# Patient Record
Sex: Female | Born: 1975 | Race: White | Hispanic: No | Marital: Married | Smoking: Never smoker
Health system: Southern US, Community
[De-identification: ages and names within clinical notes are randomized; demographics above are authoritative.]

## PROBLEM LIST (undated history)

## (undated) ENCOUNTER — Inpatient Hospital Stay (HOSPITAL_COMMUNITY): Payer: Self-pay

## (undated) DIAGNOSIS — O139 Gestational [pregnancy-induced] hypertension without significant proteinuria, unspecified trimester: Secondary | ICD-10-CM

## (undated) DIAGNOSIS — K219 Gastro-esophageal reflux disease without esophagitis: Secondary | ICD-10-CM

## (undated) HISTORY — PX: FRACTURE SURGERY: SHX138

---

## 2003-04-05 HISTORY — PX: DILATION AND CURETTAGE OF UTERUS: SHX78

## 2004-02-02 ENCOUNTER — Ambulatory Visit: Payer: Self-pay

## 2004-02-03 ENCOUNTER — Ambulatory Visit: Payer: Self-pay

## 2005-04-04 HISTORY — PX: DILATION AND CURETTAGE OF UTERUS: SHX78

## 2005-04-20 ENCOUNTER — Ambulatory Visit: Payer: Self-pay | Admitting: Obstetrics & Gynecology

## 2005-04-22 ENCOUNTER — Ambulatory Visit: Payer: Self-pay | Admitting: Obstetrics & Gynecology

## 2005-06-23 ENCOUNTER — Emergency Department: Payer: Self-pay | Admitting: Internal Medicine

## 2005-09-23 ENCOUNTER — Emergency Department: Payer: Self-pay | Admitting: Emergency Medicine

## 2006-05-15 DIAGNOSIS — O1493 Unspecified pre-eclampsia, third trimester: Secondary | ICD-10-CM

## 2008-01-03 ENCOUNTER — Emergency Department: Payer: Self-pay | Admitting: Emergency Medicine

## 2015-01-22 ENCOUNTER — Encounter: Payer: Self-pay | Admitting: General Surgery

## 2015-01-22 ENCOUNTER — Ambulatory Visit (INDEPENDENT_AMBULATORY_CARE_PROVIDER_SITE_OTHER): Payer: BLUE CROSS/BLUE SHIELD | Admitting: General Surgery

## 2015-01-22 ENCOUNTER — Other Ambulatory Visit: Payer: BLUE CROSS/BLUE SHIELD

## 2015-01-22 VITALS — BP 110/68 | HR 78 | Resp 12 | Ht 60.0 in | Wt 110.0 lb

## 2015-01-22 DIAGNOSIS — N6310 Unspecified lump in the right breast, unspecified quadrant: Secondary | ICD-10-CM

## 2015-01-22 DIAGNOSIS — N63 Unspecified lump in breast: Secondary | ICD-10-CM | POA: Diagnosis not present

## 2015-01-22 NOTE — Patient Instructions (Signed)
The patient is aware to call back for any questions or concerns.  

## 2015-01-22 NOTE — Progress Notes (Signed)
Patient ID: Bethany PedroBrandi S Mendoza, female   DOB: [birth year], 39 y.o.   MRN: [id number]  Chief Complaint  Patient presents with  . Breast Problem    nodule    HPI Bethany Mendoza is a 39 y.o. female.  who presents for a breast evaluation of a palpable right breast mass. She noticed it Monday about the size of a dime. She states it maybe a little tender. Patient does perform regular self breast checks.   She is in the process of IVF.  HPI  History reviewed. No pertinent past medical history.  Past Surgical History  Procedure Laterality Date  . Fracture surgery Left     age 39    Family History  Problem Relation Age of Onset  . Colon polyps Mother   . Colonic polyp Maternal Uncle     Social History Social History  Substance Use Topics  . Smoking status: Never Smoker   . Smokeless tobacco: None  . Alcohol Use: 0.0 oz/week    0 Standard drinks or equivalent per week     Comment: 1-2 week    Allergies  Allergen Reactions  . Penicillins Rash  . Sulfa Antibiotics Rash    Current Outpatient Prescriptions  Medication Sig Dispense Refill  . aspirin 81 MG tablet Take 81 mg by mouth daily.    . Cholecalciferol (VITAMIN D-3) 1000 UNITS CAPS Take by mouth daily.    Marland Kitchen. Leuprolide Acetate (LUPRON IJ) Inject 5 Units as directed daily.    . Multiple Vitamin (MULTIVITAMIN) capsule Take 1 capsule by mouth daily.    Marland Kitchen. estradiol (ESTRACE) 2 MG tablet Take 2 mg by mouth 3 (three) times daily.   0  . sertraline (ZOLOFT) 100 MG tablet TK 1 AND 1/2 TS PO D FOR MOOD  5  . VYVANSE 50 MG capsule TK 1 C PO QAM  0   No current facility-administered medications for this visit.    Review of Systems Review of Systems  Blood pressure 110/68, pulse 78, resp. rate 12, height 5' (1.524 m), weight 110 lb (49.896 kg), last menstrual period 12/25/2014.  Physical Exam Physical Exam  Constitutional: She is oriented to person, place, and time. She appears well-developed and well-nourished.  HENT:   Mouth/Throat: Oropharynx is clear and moist.  Eyes: Conjunctivae are normal. No scleral icterus.  Neck: Neck supple.  Cardiovascular: Normal rate, regular rhythm and normal heart sounds.   Pulmonary/Chest: Effort normal and breath sounds normal. Right breast exhibits mass. Right breast exhibits no inverted nipple, no nipple discharge, no skin change and no tenderness. Left breast exhibits no inverted nipple, no mass, no nipple discharge, no skin change and no tenderness.  2.5 cm form rubbery mobile mass right breast near axillary tail   Lymphadenopathy:    She has no cervical adenopathy.    She has no axillary adenopathy.  Neurological: She is alert and oriented to person, place, and time.  Skin: Skin is warm and dry.  Psychiatric: Her behavior is normal.    Data Reviewed Progress notes. Targeted ultrasound was preformed over the right breast mass, showed a bi lobed cystic mass.  Assessment    The right breast mass was a benign cyst that was successfully aspirated with consent.     Plan    Follow up in 3 months. May proceed with IVF.      PCP:  None Ref: Dr Rayvon Charosenow  SANKAR,SEEPLAPUTHUR G 01/22/2015, 6:19 PM

## 2015-03-11 LAB — OB RESULTS CONSOLE RUBELLA ANTIBODY, IGM: RUBELLA: IMMUNE

## 2015-03-11 LAB — OB RESULTS CONSOLE ABO/RH: RH Type: POSITIVE

## 2015-03-11 LAB — OB RESULTS CONSOLE HEPATITIS B SURFACE ANTIGEN: HEP B S AG: NEGATIVE

## 2015-03-11 LAB — OB RESULTS CONSOLE GC/CHLAMYDIA
CHLAMYDIA, DNA PROBE: NEGATIVE
GC PROBE AMP, GENITAL: NEGATIVE

## 2015-03-11 LAB — OB RESULTS CONSOLE RPR: RPR: REACTIVE

## 2015-03-11 LAB — OB RESULTS CONSOLE HIV ANTIBODY (ROUTINE TESTING): HIV: NONREACTIVE

## 2015-04-05 NOTE — L&D Delivery Note (Signed)
Delivery Note   Bethany Mendoza, Girl Bethany Mendoza [[id number]][[id number]]  At 5:08 PM a viable and healthy female was delivered via Vaginal, Spontaneous Delivery (Presentation: ;LOA ).  APGAR: 8, 9; weight  pending.   Placenta status: spontaneous, intact.  Cord:  with the following complications: none.  Anesthesia:  epidural Episiotomy:  none Lacerations:  fourth Suture Repair: 2.0 3.0 vicryl rapide and 4-0, O vicryl Est. Blood Loss (mL):  500    Craige CottaLewis, GirlB Bethany Mendoza [[id number]][[id number]]  At 5:39 PM a viable and healthy female was delivered via  (Presentation: LOA  ).  APGAR: 8, 9; weight  pending.   Placenta status: spontaneous, intact.  Cord:  with the following complications: none.  Outlet  VAVD with Kiwi x 3 pulls and no pop offs Due to maternal exhaustion. Verbal consent: obtained from patient.  Risks and benefits discussed in detail.  Risks include, but are not limited to the risks of anesthesia, bleeding, infection, damage to maternal tissues, fetal cephalhematoma.  There is also the risk of inability to effect vaginal delivery of the head, or shoulder dystocia that cannot be resolved by established maneuvers, leading to the need for emergency cesarean section     Mom to postpartum.   Baby A to Couplet care / Skin to Skin.   Baby B to Couplet care / Skin to Skin.    Otis BraceLewis, GirlA Bethany Mendoza [[id number]][[id number]]    Verbal consent: obtained from patient.  Risks and benefits discussed in detail.  Risks include, but are not limited to the risks of anesthesia, bleeding, infection, damage to maternal tissues, fetal cephalhematoma.  There is also the risk of inability to effect vaginal delivery of the head, or shoulder dystocia that cannot be resolved by established maneuvers, leading to the need for emergency cesarean section     Craige CottaLewis, GirlB Shavaughn [[id number]][[id number]]      Johnthomas Lader J 09/28/2015, 6:08 PM     Pape Parson J 09/28/2015, 6:04 PM

## 2015-04-28 ENCOUNTER — Ambulatory Visit: Payer: Self-pay | Admitting: General Surgery

## 2015-06-22 ENCOUNTER — Inpatient Hospital Stay (HOSPITAL_COMMUNITY)
Admission: AD | Admit: 2015-06-22 | Discharge: 2015-07-27 | DRG: 781 | Disposition: A | Discharge status: Home / Self Care | Payer: BLUE CROSS/BLUE SHIELD | Source: Ambulatory Visit | Attending: Obstetrics | Admitting: Obstetrics

## 2015-06-22 ENCOUNTER — Inpatient Hospital Stay (HOSPITAL_COMMUNITY): Payer: BLUE CROSS/BLUE SHIELD

## 2015-06-22 ENCOUNTER — Encounter (HOSPITAL_COMMUNITY): Payer: Self-pay

## 2015-06-22 ENCOUNTER — Ambulatory Visit (HOSPITAL_COMMUNITY): Payer: BLUE CROSS/BLUE SHIELD

## 2015-06-22 DIAGNOSIS — K219 Gastro-esophageal reflux disease without esophagitis: Secondary | ICD-10-CM | POA: Diagnosis present

## 2015-06-22 DIAGNOSIS — O368390 Maternal care for abnormalities of the fetal heart rate or rhythm, unspecified trimester, not applicable or unspecified: Secondary | ICD-10-CM

## 2015-06-22 DIAGNOSIS — O99612 Diseases of the digestive system complicating pregnancy, second trimester: Secondary | ICD-10-CM | POA: Diagnosis present

## 2015-06-22 DIAGNOSIS — Z3A25 25 weeks gestation of pregnancy: Secondary | ICD-10-CM

## 2015-06-22 DIAGNOSIS — Z3A24 24 weeks gestation of pregnancy: Secondary | ICD-10-CM

## 2015-06-22 DIAGNOSIS — O09522 Supervision of elderly multigravida, second trimester: Secondary | ICD-10-CM

## 2015-06-22 DIAGNOSIS — O3432 Maternal care for cervical incompetence, second trimester: Secondary | ICD-10-CM | POA: Diagnosis present

## 2015-06-22 DIAGNOSIS — O09292 Supervision of pregnancy with other poor reproductive or obstetric history, second trimester: Secondary | ICD-10-CM

## 2015-06-22 DIAGNOSIS — Z3A23 23 weeks gestation of pregnancy: Secondary | ICD-10-CM | POA: Diagnosis not present

## 2015-06-22 DIAGNOSIS — O09812 Supervision of pregnancy resulting from assisted reproductive technology, second trimester: Secondary | ICD-10-CM

## 2015-06-22 DIAGNOSIS — Z3490 Encounter for supervision of normal pregnancy, unspecified, unspecified trimester: Secondary | ICD-10-CM

## 2015-06-22 DIAGNOSIS — N883 Incompetence of cervix uteri: Secondary | ICD-10-CM

## 2015-06-22 DIAGNOSIS — O26872 Cervical shortening, second trimester: Secondary | ICD-10-CM

## 2015-06-22 DIAGNOSIS — O30042 Twin pregnancy, dichorionic/diamniotic, second trimester: Secondary | ICD-10-CM | POA: Diagnosis present

## 2015-06-22 DIAGNOSIS — Z23 Encounter for immunization: Secondary | ICD-10-CM | POA: Diagnosis not present

## 2015-06-22 DIAGNOSIS — R001 Bradycardia, unspecified: Secondary | ICD-10-CM

## 2015-06-22 DIAGNOSIS — O09892 Supervision of other high risk pregnancies, second trimester: Secondary | ICD-10-CM

## 2015-06-22 DIAGNOSIS — O2622 Pregnancy care for patient with recurrent pregnancy loss, second trimester: Secondary | ICD-10-CM | POA: Diagnosis present

## 2015-06-22 DIAGNOSIS — Z3A27 27 weeks gestation of pregnancy: Secondary | ICD-10-CM

## 2015-06-22 HISTORY — DX: Gestational (pregnancy-induced) hypertension without significant proteinuria, unspecified trimester: O13.9

## 2015-06-22 HISTORY — DX: Gastro-esophageal reflux disease without esophagitis: K21.9

## 2015-06-22 LAB — TYPE AND SCREEN
ABO/RH(D): O POS
Antibody Screen: NEGATIVE

## 2015-06-22 LAB — COMPREHENSIVE METABOLIC PANEL
ALBUMIN: 3 g/dL — AB (ref 3.5–5.0)
ALK PHOS: 49 U/L (ref 38–126)
ALT: 17 U/L (ref 14–54)
AST: 17 U/L (ref 15–41)
Anion gap: 5 (ref 5–15)
BILIRUBIN TOTAL: 0.8 mg/dL (ref 0.3–1.2)
BUN: 9 mg/dL (ref 6–20)
CALCIUM: 9.4 mg/dL (ref 8.9–10.3)
CO2: 25 mmol/L (ref 22–32)
Chloride: 107 mmol/L (ref 101–111)
Creatinine, Ser: 0.51 mg/dL (ref 0.44–1.00)
GFR calc Af Amer: >60 mL/min (ref >60)
GFR calc non Af Amer: >60 mL/min (ref >60)
GLUCOSE: 81 mg/dL (ref 65–99)
POTASSIUM: 4.3 mmol/L (ref 3.5–5.1)
SODIUM: 137 mmol/L (ref 135–145)
TOTAL PROTEIN: 6 g/dL — AB (ref 6.5–8.1)

## 2015-06-22 LAB — GROUP B STREP BY PCR: Group B strep by PCR: NEGATIVE

## 2015-06-22 LAB — CBC
HEMATOCRIT: 32.9 % — AB (ref 36.0–46.0)
Hemoglobin: 11 g/dL — ABNORMAL LOW (ref 12.0–15.0)
MCH: 27.3 pg (ref 26.0–34.0)
MCHC: 33.4 g/dL (ref 30.0–36.0)
MCV: 81.6 fL (ref 78.0–100.0)
PLATELETS: 210 10*3/uL (ref 150–400)
RBC: 4.03 MIL/uL (ref 3.87–5.11)
RDW: 14.7 % (ref 11.5–15.5)
WBC: 8.2 10*3/uL (ref 4.0–10.5)

## 2015-06-22 LAB — ABO/RH: ABO/RH(D): O POS

## 2015-06-22 LAB — URIC ACID: Uric Acid, Serum: 3.3 mg/dL (ref 2.3–6.6)

## 2015-06-22 MED ORDER — ACETAMINOPHEN 325 MG PO TABS
650.0000 mg | ORAL_TABLET | ORAL | Status: DC | PRN
  Administered 2015-06-24 – 2015-06-27 (×5): 650 mg via ORAL
  Filled 2015-06-22 (×5): qty 2

## 2015-06-22 MED ORDER — BETAMETHASONE SOD PHOS & ACET 6 (3-3) MG/ML IJ SUSP
12.0000 mg | INTRAMUSCULAR | Status: AC
  Administered 2015-06-22 – 2015-06-23 (×2): 12 mg via INTRAMUSCULAR
  Filled 2015-06-22 (×2): qty 2

## 2015-06-22 MED ORDER — PRENATAL MULTIVITAMIN CH
1.0000 | ORAL_TABLET | Freq: Every day | ORAL | Status: DC
  Administered 2015-06-22 – 2015-07-27 (×35): 1 via ORAL
  Filled 2015-06-22 (×35): qty 1

## 2015-06-22 MED ORDER — PANTOPRAZOLE SODIUM 40 MG PO TBEC
40.0000 mg | DELAYED_RELEASE_TABLET | Freq: Every day | ORAL | Status: DC
  Administered 2015-06-22 – 2015-07-27 (×36): 40 mg via ORAL
  Filled 2015-06-22 (×36): qty 1

## 2015-06-22 MED ORDER — ZOLPIDEM TARTRATE 5 MG PO TABS: 5.0000 mg | ORAL_TABLET | Freq: Every evening | ORAL | Status: DC | PRN

## 2015-06-22 MED ORDER — ASPIRIN 81 MG PO CHEW
81.0000 mg | CHEWABLE_TABLET | Freq: Every day | ORAL | Status: DC
  Administered 2015-06-22 – 2015-07-27 (×36): 81 mg via ORAL
  Filled 2015-06-22 (×37): qty 1

## 2015-06-22 MED ORDER — PROGESTERONE MICRONIZED 200 MG PO CAPS
200.0000 mg | ORAL_CAPSULE | Freq: Every day | ORAL | Status: DC
  Administered 2015-06-22 – 2015-07-26 (×35): 200 mg via VAGINAL
  Filled 2015-06-22 (×34): qty 1

## 2015-06-22 MED ORDER — LACTATED RINGERS IV SOLN
INTRAVENOUS | Status: DC
  Administered 2015-06-22 – 2015-06-27 (×11): via INTRAVENOUS

## 2015-06-22 MED ORDER — DOCUSATE SODIUM 100 MG PO CAPS
100.0000 mg | ORAL_CAPSULE | Freq: Every day | ORAL | Status: DC
  Administered 2015-06-22 – 2015-06-29 (×8): 100 mg via ORAL
  Filled 2015-06-22 (×8): qty 1

## 2015-06-22 MED ORDER — CALCIUM CARBONATE ANTACID 500 MG PO CHEW
2.0000 | CHEWABLE_TABLET | ORAL | Status: DC | PRN
  Administered 2015-06-26 – 2015-07-26 (×13): 400 mg via ORAL
  Filled 2015-06-22 (×14): qty 2

## 2015-06-22 NOTE — H&P (Addendum)
Chief complaint: Cervical incompetence  History of present illness: This is a 40 year old G5 P1 031 with a di-di twin pregnancy at 23 weeks and 0 days who is been followed as an outpatient for progressive cervical shortening and diagnosed with cervical incompetence today. This is a donor egg IVF pregnancy in the setting of 2 prior D&Cs, 2 prior failed IVF cycles with embryo transfer and prior laparoscopy. Patient does not have a history of LEEP. Given her history of IVF pregnancy patient had a cervical length at 16 weeks of pregnancy which was 6 cm. Repeat cervical length at 18 weeks showed the cervix to be 3.5 cm. Given this decrease patient returned at 20 weeks and cervical length was 2.7 cm. Due to the continued progression a repeat cervical length at 21 weeks showed the cervix to be 2.7 cm which decreased to 2.4 cm with fundal pressure. At that time given the relative stability the patient remained at home on modified bedrest and followed up today at 23 weeks for another cervical length. Today the cervix was 1.5 cm at rest decreasing to 1.2 cm with fundal pressure. She had a U-shaped funnel 2 cm in width and 4 cm in length.  Prenatal issues: - Cervical incompetence as outlined above. Admitted for further monitoring and treatment. - IVF pregnancy. This is a donor egg pregnancy as her first 2 rounds of IVF led to poor egg production. Patient remained on estradiol and progesterone until 12 weeks of pregnancy. She has remained on baby aspirin. Patient with history of MAB 3 and infertility. She had a negative thrombophilia workup and a normal laparoscopy and evaluation of the infertility. - History of gestational hypertension. Patient notes induction of labor at 38 weeks with her first pregnancy 9 years ago. Patient has had no interval hypertension. Patient has remained on baby aspirin for this history and baseline labs for preeclampsia have not yet been done but will draw a metabolic profile and a uric acid  today. - History of precipitous delivery. With her first pregnancy patient had a three-hour induction of labor as a G1 and precipitous delivery after artificial rupture of membranes.  - Fetal arrhythmia. First noticed today, transient, on baby A. The request MFM evaluation  Past Medical History  Diagnosis Date  . Pregnancy induced hypertension   . GERD (gastroesophageal reflux disease)     Past Surgical History  Procedure Laterality Date  . Fracture surgery Left     age 25  . Dilation and curettage of uterus  2007  . Dilation and curettage of uterus  2005    PN labs: O+, AFP neg, declined genetic testing, donor egg, donor 40 yo  Meds: PNV, Zantac All: PCN, Sulfa- rash  PE:  Filed Vitals:   06/22/15 1211 06/22/15 1300  BP: 104/69   Pulse: 101   Temp: 98.4 F (36.9 C)   TempSrc: Oral   Resp: 15   Height:   (1.549 m)  Weight:  61.236 kg (135 lb)   Gen.: Well-appearing, no distress, slight anxiety Cardiovascular: Regular rate and rhythm, no murmurs Pulmonary: Clear to auscultation bilaterally Back: No costovertebral angle tenderness Abdomen: Soft, gravid, nontender, no fundal tenderness, no right upper quadrant pain GU: Pelvic exam deferred Lower extremity: Nontender, no edema  CBC    Component Value Date/Time   WBC 8.2 06/22/2015 1225   RBC 4.03 06/22/2015 1225   HGB 11.0* 06/22/2015 1225   HCT 32.9* 06/22/2015 1225   PLT 210 06/22/2015 1225   MCV 81.6  06/22/2015 1225   MCH 27.3 06/22/2015 1225   MCHC 33.4 06/22/2015 1225   RDW 14.7 06/22/2015 1225    Ultrasound: Growth ultrasound plan today. Cervical length ultrasound 3/20 cervix 1.2 cm with fundal pressure, U-shaped funnel 2 cm in width and 4 cm in length  Assessment and plan: 40 year old G5 P1 031 at 23 weeks and 0 days with progressive cervical shortening and now diagnosis of cervical incompetence. - Cervical incompetence. Will plan admission for bedrest, allowed bathroom privileges. MFM consult.  Betamethasone. Vaginal Prometrium. We have also discussed cervical cerclage, cervical pessary, magnesium prophylaxis and have decided against these 3 due to risks of cerclage, unclear benefit of pessary and low likelihood of delivery in the next 24 hours. If MFM feel strongly about magnesium for 24 hours we will add this back. At this time we are planning inpatient evaluation for the next week and repeating a cervical length in one week. - Fetal evaluation. Risks due to prematurity and will have NICU, and discuss with patient and her husband. Will plan NST daily. - Transient arrhythmia on baby A. Will have MFM evaluate and leave recommendations - History of gestational hypertension. Will plan baseline labs today and continue baby aspirin. No hypertension at this time.  - Mode of delivery. As babies are currently vertex vertex will  plan vaginal delivery. If you have baby has malpresentation at this extreme prematurity would plan cesarean section . [facility]- Hospital resources. Currently the NICU is closed. I discussed this with the patient and if delivery becomes more likely will transfer her to a another facility. If patient has rapid progression and maternal transfers unsafe will deliver babies here and patient and husband aware that babies will have to be transferred out. Patient and husband agreed to these risks.  - Prophylaxis. Ambien as needed and in the short-term but would not consider this for long-term use. SCDs for DVT prophylaxis. Continue prenatal vitamin. Plan antireflux measures.   Bethany Eldredge A. 06/22/2015 3:18 PM    At least 70 minutes were spent discussing plan of care with patient, husband and consultants.

## 2015-06-22 NOTE — Progress Notes (Signed)
Called to see patient by ultrasound staff for fetal bradycardia  While patient was undergoing growth ultrasound 423 week twins in the setting of cervical incompetence I was called for a bradycardia to the 90s on baby A. When I arrived ultrasound technician had told me the baby was in the 8510 95 bpm range for the past 20 minutes. Despite this good fetal movement was seen. Quick discussion was had with the patient regarding fetal bradycardia and unclear if this was a underlying cardiac condition or possibly related to  placental insufficiency. To date babies had been well grown and patient had no risk factors for placental insufficiency. We also discussed the extreme prematurity at 23 weeks not yet having received betamethasone and the very high morbidity mortality for both babies at 23 weeks. We quickly discussed risk and benefits of urgent cesarean section versus continued expectant management knowing that a possible risk is demise of twin A. We decided that given the very high morbidity mortality at 23 weeks to both babies we would plan expectant management on twin A for the benefit of twin B. I also felt that this time that the underlying condition for bradycardia on twin A was not placental insufficiency as good amniotic fluid and good fetal movement was noted .  During this time MFM consult Dr. Particia NearingMartha Decker was en route to the ultrasound room. Together we discussed the fetal bradycardia and it was felt that this was a arrhythmia and not a sinus bradycardia.   At this time will continue plan for betamethasone for cervical incompetence with bedrest and to continue plan for vaginal Prometrium. We will defer magnesium as delivery does not appear to be imminent and we had agreed that cervical cerclage and cervical pessary are not indicated in this patient. To address the fetal arrhythmia we will ask for a fetal echocardiogram by pediatric cardiology. To better evaluate fetal status we will plan for MFM to  ultrasound baby A twice daily to evaluate the arrhythmia and rule out hydrops. Should fetal heart remain at 50 or below baby is at risk for hydrops and will consider early delivery .  Patient and husband agreed to this plan .  Bethany Mendoza A. 06/22/2015 7:54 PM

## 2015-06-23 ENCOUNTER — Encounter (HOSPITAL_COMMUNITY): Payer: Self-pay | Admitting: General Practice

## 2015-06-23 ENCOUNTER — Inpatient Hospital Stay (HOSPITAL_COMMUNITY)
Admit: 2015-06-23 | Discharge: 2015-06-23 | Disposition: A | Discharge status: Home / Self Care | Payer: BLUE CROSS/BLUE SHIELD | Attending: Obstetrics | Admitting: Obstetrics

## 2015-06-23 ENCOUNTER — Inpatient Hospital Stay (HOSPITAL_COMMUNITY): Payer: BLUE CROSS/BLUE SHIELD

## 2015-06-23 MED ORDER — MAGNESIUM SULFATE BOLUS VIA INFUSION
4.0000 g | Freq: Once | INTRAVENOUS | Status: AC
  Administered 2015-06-23: 4 g via INTRAVENOUS
  Filled 2015-06-23: qty 500

## 2015-06-23 MED ORDER — MAGNESIUM SULFATE 50 % IJ SOLN
1.0000 g/h | INTRAVENOUS | Status: DC
  Filled 2015-06-23: qty 80

## 2015-06-23 NOTE — Progress Notes (Signed)
Bethany PennaFogleman, MD aware of A's FHR; no intervention at this time; continue with NST

## 2015-06-23 NOTE — Progress Notes (Addendum)
MD fogleman reviewed strip and aware of sudden drops of twin A FHR to 50s and sudden increases back to the 5080s. Ordered to continue FHR monitoring as ordered and that no interventions are necessary at this time.

## 2015-06-23 NOTE — Progress Notes (Addendum)
Subjective: Patient notes no contractions but some feeling of lower pelvic pressure. No fevers, no chest pain. No vaginal bleeding or leaking of fluid. Patient notes increased anxiety related to no findings of fetal arrhythmia and the unclear outcome at this time.  Objective: Filed Vitals:   06/23/15 1024 06/23/15 1034 06/23/15 1103 06/23/15 1203  BP: 104/65 112/65 114/68 109/63  Pulse: 102 103 100 99  Temp:    98.2 F (36.8 C)  TempSrc:    Oral  Resp: Height:      Weight:      SpO2:       Gen.: Well-appearing but anxious and in no acute distress Abdomen: Gravid, nontender, no palpable contractions, no right upper quadrant pain GU: Deferred Lower extremity: Nontender, no edema, SCDs in place  Tocometry: No discrete contractions but continued irritability throughout the night FH: NSTs are quite difficult to perform at this early gestational age and with active fetal movement. Baby a: Baseline mostly in the 80s with intermittent heart rate in the 50s. Overnight these episodes were up to 2 minutes but this morning these episodes are lasting up to 5 minutes. These are occurring at the time of active fetal movement. Good variability. Baby B: Baseline at 130s with accelerations and no decelerations. At times the monitoring strip appears to show decelerations though these are likely maternal.   CBC    Component Value Date/Time   WBC 8.2 06/22/2015 1225   RBC 4.03 06/22/2015 1225   HGB 11.0* 06/22/2015 1225   HCT 32.9* 06/22/2015 1225   PLT 210 06/22/2015 1225   MCV 81.6 06/22/2015 1225   MCH 27.3 06/22/2015 1225   MCHC 33.4 06/22/2015 1225   RDW 14.7 06/22/2015 1225     Assessment and plan: 40 year old G5 P1 031 admitted for cervical incompetence with progressive cervical shortening and found to have fetal arrhythmia of baby a.  - Cervical incompetence. Patient has been admitted for betamethasone, vaginal Prometrium and these were both started last night. Given the  continued irritability seen through admission decision was made to and magnesium sulfate both for neuro protection and for tocolytic effect. If irritability resumes after 24 hours of magnesium sulfate will continue consultation with maternal-fetal medicine and pediatric cardiology over risks and benefits of Indocin versus Procardia for toco lysis. We also discussed with the patient possibilities of cervical pessary and cervical cerclage and have decided against these. Formal MFM consult is pending.  - Fetal arrhythmia. Fetal echo pending today as well as pediatric cardiology consult. Patient with no prior history of lupus and etiology of the arrhythmia is unclear. Patient is aware that current episodes of bradycardia are likely due to this arrhythmia and not a sign of placental insufficiency. Await input from cardiology on etiologies and possible treatments. Patient is aware that in utero treatment of baby A will also have some impact on baby B.  - Fetal well being. Given the extreme prematurity we would not move to delivery for concern of baby A at this time as this puts both babies at high morbidity and mortality from extreme prematurity. Patient aware that the A is at risk for heart failure with a continued arrhythmia and bradycardia. I have discussed with the patient and her husband that if baby A  is getting sicker and delivery is indicated that we may have to make a decision of whether to intervene for baby A with delivery of both babies and subject baby B to risks of extreme prematurity.  They are aware that the decision to not intervene could lead to expiration of baby A. We will make this ongoing decision on a week by week basis as risks and benefits change. We have asked for MFM and NICU consultation to help with this decision.   Dartha Rozzell A. 06/23/2015 1:07 PM    Fetal echo done- benign PAC's as the cause for arhythmic bradycardia per Dr. Mayer Camelatum. Very low risks of heart block and heart  failure. He recommends repeat echo i 1 wk and to assess for hydrops if FH persistently in the 50's. This is likely to resolve on it's own. He also did not feel there was any significant risk of indocin at this time.   A/P: Continue Magnesium for neuroprophlyaxis and preterm labor. If irritability persists after magnesium stops will plan 48 hrs of Indocin. Continue bedrest and repeat CL in 1 wk. Plan digital cervical check if si/sx PTL. Will plan doppler q shift to assess FH.   About 60 min were spent in coordinating care on this patient today.   Claudy Abdallah A. 06/23/2015 3:47 PM

## 2015-06-23 NOTE — Consult Note (Signed)
Reason for Referral: Fetal Arrhythmia   History of Present Illness: I had the pleasure of seeing Bethany Mendoza for fetal cardiac consultation at the request of Dr Ernestina Penna.  Bethany Mendoza is a 40 y.o. year old G58P1031 woman currently 23 1/[redacted] weeks pregnant with twin female fetuses.  Her pregnancy is result of IVF with donor eggs. Her estimated due date is October 19, 2015.  The indication for today's visit includes fetal arrhythmia.  During OB visit yesterday Twin A was noted to have occasional irregular beats.  Later during anatomy scan periods of bradycardia down to 80 were noted.  She was observed overnight with transient episodes of bradycardia to 50 being observed.  Bradycardia reportedly improving during course of day. She did have caffeine shortly before her OB visit yesterday.  She denies use of alcohol, tobacco or other stimulants. Complications of her current pregnancy include shortened cervix. She denies any other complications with this pregnancy. She has felt good fetal movement. The available medical record was reviewed in detail and is in agreement with the HPI and past medical history.   Past Medical History: Past Medical History  Diagnosis Date  . Pregnancy induced hypertension   . GERD (gastroesophageal reflux disease)    Past Surgical History  Procedure Laterality Date  . Fracture surgery Left     age 47  . Dilation and curettage of uterus  2007  . Dilation and curettage of uterus  2005     Medications: Bethany Mendoza @   Allergies: Allergies  Allergen Reactions  . Penicillins Rash    Has patient had a PCN reaction causing immediate rash, facial/tongue/throat swelling, SOB or lightheadedness with hypotension: No Has patient had a PCN reaction causing severe rash involving mucus membranes or skin necrosis: No Has patient had a PCN reaction that required hospitalization No Has patient had a PCN reaction occurring within the last 10 years: No If all of the above answers are  "NO", then may proceed with Cephalosporin use.   . Sulfa Antibiotics Rash    Family History: There is no known family history of congenital heart disease, arrhythmias, sudden cardiac death, or other birth defects.  Social History: History  Smoking status  . Never Smoker   Smokeless tobacco  . Not on file    Review of Systems A 10+ point review of systems is negative except as detailed in HPI.  Objective: BP 114/67 mmHg  Pulse 117  Temp(Src) 98.5 F (36.9 C) (Oral)  Resp 16  Ht  (1.549 m)  Wt 61.236 kg (135 lb)  BMI 25.52 kg/m2  SpO2 97%  LMP 12/25/2014 (Exact Date)  Patient is well appearing and in no distress. Glasses in place. She has normal work of breathing. Abdomen is significant for a gravida uterus but is otherwise soft and non-tender. Extremities - no swelling or edema noted. Neuro - grossly intact without focal deficits.  Fetal Echocardiogram: A complete study was performed today on Twin A, which was technically good.  There is normal fetal cardiac anatomy and function.  No major structural heart disease was identified.  Frequent conducted and non-conducted premature atrial beats were noted.  No sustained arrhythmias occurred.  No evidence of heart block noted.  Final Diagnosis: 1. Fetal premature atrial contractions. 2. Fetal bradycardia.  Discussion: I am happy to report that  Bethany Mendoza's fetal heart appears to have normal cardiac anatomy and function.  An irregular fetal heart rate is a common complication occuring in up to 10% of all  pregnancies with persistent arrhythmias occuring in 1-2% of pregnancies.  Isolated premature atrial beats are the most common type of fetal arrhythmias and appear to be the cause of the irregular heart rate and the bradycardia noted in Bethany Mendoza's fetus.  In general isolated premature atrial contractions are a benign finding that will resolve spontaneously either during the pregnancy or in the first months of postnatal life.   Despite this, there is a small risk of PAC's triggering a supraventricular tachycardia in a fetus with an underlying substrate for SVT.  This is seen in approximately 0.5-2% of patients with fetal PAC's.   Bethany Mendoza was instructed to avoid caffeine, tobacco and alcohol to minimize the risk of further extrasystolic beats.  She was instructed to seek medical attention to assess for presence of tachycardia if she notices decreased fetal movement.  She should have fetal heart rate checked periodically while she remains inpatient and at least weekly for the next four weeks in obstetrician's office.  I would like to see her again in four weeks time for reassessment of fetal rhythm given frequency of arrhythmia at this time. She should be seen sooner for persistent tachycardia or bradycardia. These results were discussed in detail, and all questions were answered.      The limitations to fetal echocardiography include the presence of small to moderate atrial and ventricular septal defects, mild valve abnormalities, persistence of the ductus arteriosus after birth, postnatal development of coarctation of the aorta, and other cardiac and vascular anomalies too subtle to be imaged prenatally. These limitations were discussed with the patient and her family.    Recommendations: Repeat fetal cardiac imaging: Four weeks as long as ectopy persists. Alterations to maternal prenatal care: Monitor fetal heart rate at least weekly for next four weeks.  Alterations to delivery planning: None. Alterations to fetal postnatal care: None currently indicated.  It was my pleasure to meet and evaluate Bethany Mendoza today. If there are any questions or concerns regarding this evaluation, please do not hesitate to contact me.    Sincerely,  Darlis LoanGreg Frederika Hukill, MD Associate Professor Pediatric Cardiology [facility] Phone: [phone], Fax: [phone] [facility] Phone: [phone], [phone]: [phone] On call: [phone] or  [phone] [email or web address]

## 2015-06-23 NOTE — Progress Notes (Signed)
MD Fogleman reviewed FHR strip; twins very difficult to trace; multiple nurses adjusted US for >2 hr; Fogleman aware of FHR and ordered ok to come off EFM now and that no further intervention is necessary at this time.

## 2015-06-23 NOTE — Progress Notes (Signed)
MFM Consult  40 year old, [id number], currently at 23 weeks 1 day gestation with EDD 10/19/2015, seen in evaluation for twin gestation, cervical shortening and fetal bradyarrhythmia in twin A.  Past Medical History  Diagnosis Date  . Pregnancy induced hypertension   . GERD (gastroesophageal reflux disease)    Past Surgical History  Procedure Laterality Date  . Fracture surgery Left     age 40  . Dilation and curettage of uterus  2007  . Dilation and curettage of uterus  2005   OB History    Gravida Para Term Preterm AB TAB SAB Ectopic Multiple Living   5 1 1  3  3   1       Obstetric Comments   1st Menstrual Cycle:  15 1st Pregnancy:  27      Family History  Problem Relation Age of Onset  . Colon polyps Mother   . Colonic polyp Maternal Uncle    Allergies  Allergen Reactions  . Penicillins Rash    Has patient had a PCN reaction causing immediate rash, facial/tongue/throat swelling, SOB or lightheadedness with hypotension: No Has patient had a PCN reaction causing severe rash involving mucus membranes or skin necrosis: No Has patient had a PCN reaction that required hospitalization No Has patient had a PCN reaction occurring within the last 10 years: No If all of the above answers are "NO", then may proceed with Cephalosporin use.   . Sulfa Antibiotics Rash   No current facility-administered medications on file prior to encounter.   Current Outpatient Prescriptions on File Prior to Encounter  Medication Sig Dispense Refill  . aspirin 81 MG tablet Take 81 mg by mouth daily.    . Cholecalciferol (VITAMIN D-3) 1000 UNITS CAPS Take by mouth daily.     Pediatric echo returned with no regular brady arrhythmia and a notation of premature atrial contractions  #1 Fetal PAC's - she has minimum caffeine or stimulant intake in her food or medication and will stop known caffeine intake - skipped beats are fairly common as fetal arrhythmias go and generally of little significance -  sometimes these will increase in frequency during labor, but seldom persist post delivery #2 Cervical shortening - Cervix has shortened from 6 to 1.2 cm on office cervical lengths - we discussed the increased risk of early delivery with shortening, but not all short cervices will delivery early. With twin gestation, more than 50% of pregnancies will delivery prior to [redacted] weeks gestation. - continue current medications such as vaginal progesterone - s/p antenatal steroids. Consider rescue dose if still undelivered in ~4-6 weeks - can consider stopping magnesium if no regular uterine contractions and no consideration of delivery for the above #3 Di/di twin gestation - routine antenatal assessment and ultrasounds for twin gestation  Questions appear answered to her satisfaction. Precautions for the above given. Spent greater than 1/2 of 40 minute visit in face to face counseling and records review

## 2015-06-23 NOTE — Progress Notes (Signed)
Dr. Ernestina PennaFogleman aware of FHR and ok to have pt. Come off monitoring at this time.

## 2015-06-24 MED ORDER — ONDANSETRON HCL 40 MG/20ML IJ SOLN
8.0000 mg | Freq: Once | INTRAMUSCULAR | Status: AC
  Administered 2015-06-24: 8 mg via INTRAVENOUS
  Filled 2015-06-24: qty 4

## 2015-06-24 MED ORDER — INDOMETHACIN 50 MG PO CAPS
50.0000 mg | ORAL_CAPSULE | Freq: Once | ORAL | Status: AC
  Administered 2015-06-24: 50 mg via ORAL
  Filled 2015-06-24: qty 1

## 2015-06-24 MED ORDER — SODIUM CHLORIDE 0.9% FLUSH
3.0000 mL | Freq: Two times a day (BID) | INTRAVENOUS | Status: DC
  Administered 2015-06-27 – 2015-06-29 (×4): 3 mL via INTRAVENOUS

## 2015-06-24 MED ORDER — INDOMETHACIN 25 MG PO CAPS
25.0000 mg | ORAL_CAPSULE | Freq: Four times a day (QID) | ORAL | Status: DC
  Administered 2015-06-24 – 2015-06-26 (×8): 25 mg via ORAL
  Filled 2015-06-24 (×11): qty 1

## 2015-06-24 NOTE — Progress Notes (Signed)
Pt is complaining of nausea, headache, and feeling ache all over.  Feels SOB.  Repositioned pt and informed pt she needed to eat something due to pt not eating since 1pm.   Pt ate some strawberries and was given Zofran, pt states she is feeling a little better but still ache and it is hard to catch her breath.  Doctor Ernestina PennaFogleman was notified and informed of assessment.  Pt is not running a fever and vitals remain WDL.

## 2015-06-24 NOTE — Progress Notes (Signed)
Hospital day #3 Cervical incompetence Donor egg IVF di-di twin pregnancy Fetal arrhythmia baby A 38'[redacted] wks gestation  Subjective: Patient notes good fetal movement 2, no discrete contractions but noted some cramping overnight. None now. No leakage of fluid, no vaginal bleeding. Anxiety much relieved after normal fetal echo. Patient is tolerating vaginal Prometrium.  Yesterday patient had pediatric cardiology consult which revealed structurally normal heart 2. Baby A was having frequent PACs that were nonconducted and caused the bradycardia. He felt this would resolve on its own and has very low risk of fetal harm. Plan for repeat echo in 1 month showed continued bradycardic episodes be noted. He also recommended daily heart tone checks while in-house and then weekly if patient is discharged to home  Patient also had consult with maternal fetal medicine who agreed with care plan.  Objective:  Filed Vitals:   06/24/15 0657 06/24/15 0807 06/24/15 0930 06/24/15 1232  BP:  106/62  114/64  Pulse:  100  102  Temp:  98.2 F (36.8 C)  98 F (36.7 C)  TempSrc:  Oral  Oral  Resp: 16 20 20 20   Height:      Weight:      SpO2:       Gen.: Well-appearing, no distress Abdomen: Gravid, nontender, no right upper quadrant pain, no uterine tightening GU deferred Lower extremity: Nontender, no edema  Doppler a: 130s Doppler be: 140s Tocometry: Irritability but no contractions  Group B strep negative  Assessment and plan: 40 year old G5 P1 031 at 23 weeks and 2 days admitted for cervical incompetence and further evaluation of fetal arrhythmia.  - fetal arrhythmia. Likely benign PACs, may present risk of hydrops if FH staying in 50-80's for prolonged periods (several days). Plan repeat echo in 1 month. Doppler q shift.   - awaiting NICU consult  - cervical incompetence. Repeat CL u/s in 1 wk. Vaginal exam deferred as risk of introducing infection and would not change management at this point.  Pt doing vaginal prometrium and is s/p 24 hrs Mag Sulfate and BMZ. Given continued irritability will plan 48 hrs indocin.  Raif Chachere A. 06/24/2015 5:54 PM

## 2015-06-25 LAB — TYPE AND SCREEN
ABO/RH(D): O POS
Antibody Screen: NEGATIVE

## 2015-06-25 MED ORDER — ONDANSETRON HCL 4 MG/2ML IJ SOLN
4.0000 mg | Freq: Two times a day (BID) | INTRAMUSCULAR | Status: DC | PRN
  Administered 2015-06-25: 4 mg via INTRAVENOUS
  Filled 2015-06-25: qty 2

## 2015-06-25 NOTE — Progress Notes (Signed)
Hospital day #4 Cervical incompetence Donor egg IVF di-di twin pregnancy Fetal arrhythmia baby A- benign PACs 23'[redacted] wks gestation  Subjective: Patient notes good fetal movement 2, no discrete contractions and no longer with irritability. Pt did have nausea last night, relieved with Zofran. She is noting increase in GERD, taking protonix and prn Tums and diet choices d/w pt (currently eating pizza). Started Indocin yesterday, stopped Mag. Pt notes HA and thinks it's from abruptly stopping caffeine.   No leakage of fluid, no vaginal bleeding. Anxiety much relieved after normal fetal echo. Patient is tolerating vaginal Prometrium.  Objective:  Filed Vitals:   06/25/15 0816 06/25/15 1239 06/25/15 1618 06/25/15 2014  BP:  96/55 103/60 104/58  Pulse: 94 103 101 95  Temp:  97.8 F (36.6 C) 97.6 F (36.4 C) 98.2 F (36.8 C)  TempSrc:  Oral Oral Oral  Resp:  20 18 20   Height:      Weight:      SpO2: 96%      Gen.: Well-appearing, no distress Abdomen: Gravid, nontender, no right upper quadrant pain, no uterine tightening GU deferred Lower extremity: Nontender, no edema  Doppler a: 130s Doppler be: 140s Tocometry: no irritability  Group B strep negative  Assessment and plan: 40 year old G5 P1 031 at 2112w3d  days admitted for cervical incompetence and further evaluation of fetal arrhythmia.  - fetal arrhythmia. Likely benign PACs, may present risk of hydrops if FH staying in 50-80's for prolonged periods (several days). Plan repeat echo in 1 month. Doppler q shift. Will start once daily NST.   - awaiting NICU consult  - cervical incompetence. Repeat CL u/s in 1 wk. Vaginal exam deferred as risk of introducing infection and would not change management at this point. Pt doing vaginal prometrium and is s/p 24 hrs Mag Sulfate and BMZ.  On Indocin x 48 hrs for continued irritability and seems to be working well.   Late Entry, pt [facility]Heru Montz A. 06/25/2015 10:34 PM

## 2015-06-26 NOTE — Progress Notes (Signed)
Hospital day #5 Cervical incompetence Donor egg IVF di-di twin pregnancy Fetal arrhythmia baby A- benign PACs 23'[redacted] wks gestation  Subjective: Patient notes good fetal movement 2, no discrete contractions and no longer feeling irritability. Pt notes increase fatigue today, slept all day. No focal symptoms. Does feel winded when getting up to void. Completing 48 hr indocin now.   No leakage of fluid, no vaginal bleeding.  Patient is tolerating vaginal Prometrium.  Objective:  Filed Vitals:   06/26/15 1100 06/26/15 1203 06/26/15 1602 06/26/15 1946  BP:  103/57 103/58 103/61  Pulse: 100 96 94 94  Temp:  98.4 F (36.9 C) 98 F (36.7 C) 98.2 F (36.8 C)  TempSrc:  Oral Oral Oral  Resp:  20 18 18   Height:      Weight:      SpO2: 91%      Gen.: Well-appearing, no distress Abdomen: Gravid, nontender, no right upper quadrant pain, no uterine tightening GU deferred Lower extremity: Nontender, no edema  NST A: audible irreg rhythym. Mostly with baseline in 150's, but episodes of arrythmia at 60-90's, good variability. B: 150's, + 10x10 accels, no decels,  Tocometry: irritability with no discreet contractions.   Group B strep negative  Assessment and plan: 40 year old G5 P1 031 at 8469w4d  days admitted for cervical incompetence and further evaluation of fetal arrhythmia.  - fetal arrhythmia. Likely benign PACs, may present risk of hydrops if FH staying in 50-80's for prolonged periods (several days) though baseline mostly in 150s. Plan repeat echo in 1 month. Doppler q shift and daily NST.   - awaiting NICU consult  - cervical incompetence. Repeat CL u/s in 1 wk after admission. Vaginal exam deferred as risk of introducing infection and would not change management at this point. Pt doing vaginal prometrium and is s/p 24 hrs Mag Sulfate and 48 hrs Indocin and BMZ. If pt starts feeling increased irritability or contractions, or if worsening cervical length, will consider procardia.    Late Entry, pt [facility]Lindsay Straka A. 06/26/2015 10:09 PM

## 2015-06-27 NOTE — Consult Note (Signed)
Neonatology Consult Note:  At the request of the patients obstetrician Dr. Pamala Hurry I met with Bethany Mendoza, her mother and family friend.  She is currently 23 5 weeks with pregancy complicated by di-di twin donor egg IVF pregnancy, cervical incompetence, fetal arrhythmia baby A- benign PACs.  S/p betamethasone 3/20-21.   We discussed morbidity/mortality at this gestional age, delivery room resuscitation, including intubation and surfactant in DR.  Discussed mechanical ventilation and risk for chronic lung disease, risk for IVH with potential for motor / cognitive deficits, ROP, NEC, sepsis, as well as temperature instability and feeding immaturity.  Discussed NG / OG feeds, benefits of MBM in reducing incidence of NEC.  Discussed likely length of stay. They desire all resuscitative measures.  We discussed the possible but unlikely scenario in which intervention might be needed for baby A due to cardiac indications and the difficulty in placing baby B at risk for mortality / poor outcome.  While this is an individual decision we discussed 25 weeks at being a reasonable threshold as survival increases to 75% and morbidity is lower than at earlier gestational ages.    Thank you for allowing [place] to participate in her care.  Please call with questions.  Higinio Roger, DO  Neonatologist  The total length of face-to-face or floor / unit time for this encounter was 30 minutes.  Counseling and / or coordination of care was greater than fifty percent of the time.

## 2015-06-27 NOTE — Progress Notes (Addendum)
Hospital day #6 Cervical incompetence Donor egg IVF di-di twin pregnancy Fetal arrhythmia baby A- benign PACs 23'[redacted] wks gestation  Subjective: Patient notes good fetal movement 2, no discrete contractions and no longer feeling irritability. Pt notes overall not feeling well but no discreet symptom other than headache. Continues to  feel winded when getting up to void. Voiding frequently due to IV fluids. Stopped 48 hrs indocin yesterday afternoon.   No leakage of fluid, no vaginal bleeding.  Patient is tolerating vaginal Prometrium.  Objective:  Filed Vitals:   06/27/15 1117 06/27/15 1122 06/27/15 1127 06/27/15 1217  BP:    112/60  Pulse: 101 100 97 100  Temp:    97.9 F (36.6 C)  TempSrc:    Oral  Resp:    18  Height:      Weight:      SpO2: 93% 94% 94%    Gen.: Well-appearing, no distress CV: RRR Pulm: crackles in R lower lung. Otherwise clear. Abdomen: Gravid, nontender, no right upper quadrant pain, no uterine tightening GU deferred Lower extremity: Nontender, no edema  NST A: audible irreg rhythym. Spending 1/2 time with baseline in 150's, and 1/2 time in episodes of arrythmia with FH at either 80's of 50's. good variability. B: 150's, + 10x10 accels, no decels,  Tocometry: irritability with no discreet contractions.   Group B strep negative  Assessment and plan: 40 year old G5 P1 031 at 7022w5d  days admitted for cervical incompetence and further evaluation of fetal arrhythmia.  - fetal arrhythmia. Likely benign PACs, may present risk of hydrops if FH staying in 50-80's for prolonged periods (several days) though baseline mostly in 150s. Plan repeat echo in 1 month. Doppler q shift and daily NST.   - awaiting NICU consult. Discussed pt with Dr. Eric FormWimmer today.   - cervical incompetence. Repeat CL u/s in 1 wk after admission. Vaginal exam deferred as risk of introducing infection and would not change management at this point. Pt doing vaginal prometrium and is s/p 24 hrs  Mag Sulfate and 48 hrs Indocin and BMZ. If pt starts feeling increased irritability or contractions, or if worsening cervical length, will consider procardia.    Flornce Record A. 06/27/2015 3:48 PM     HA- unclear etiology, will try humidified air (pt to bring in humidifier). Will allow wheelchair ride outside x 1 hr for pt to get fresh air and see her 19 yr old son.  - Pulmonary crackles. Re-eval after stopping IVF, if continues will plan CXR, no evidence for infection, possibly atelectasis vs mild pulmonary edema.   Aniel Hubble A. 06/27/2015 4:31 PM

## 2015-06-28 LAB — TYPE AND SCREEN
ABO/RH(D): O POS
ANTIBODY SCREEN: NEGATIVE

## 2015-06-28 NOTE — Progress Notes (Signed)
Hospital day #7 Cervical incompetence Donor egg IVF di-di twin pregnancy Fetal arrhythmia baby A- benign PACs 23'[redacted] wks gestation  Subjective: Patient notes good fetal movement but unable to discern baby A from baby B. Some increased cramping last night, none now. Pt feels well, no longer with HA. IV fluids stopped.   No leakage of fluid, no vaginal bleeding.  Patient is tolerating vaginal Prometrium.  Objective:  Filed Vitals:   06/28/15 1016 06/28/15 1021 06/28/15 1023 06/28/15 1026  BP:      Pulse: 110 103 103 104  Temp:      TempSrc:      Resp:      Height:      Weight:      SpO2:        Gen.: Well-appearing, no distress Abdomen: Gravid, nontender, no right upper quadrant pain, no uterine tightening GU deferred Lower extremity: Nontender, no edema  NST A: audible irreg rhythym. Spending most time in 90's, up to 150's for very short spurt. Down to 50's for up to 1/2 time. good variability. B: 150's, + 10x10 accels, no decels,  Tocometry: irritability with no discreet contractions.   Group B strep negative  Assessment and plan: 40 year old G5 P1 031 at 6634w6d  days admitted for cervical incompetence and further evaluation of fetal arrhythmia.  - fetal arrhythmia. Likely benign PACs, may present risk of hydrops if FH staying in 50-80's for prolonged periods (several days). Eval for hydrops with u/s tomorrow though this is very unlikeley. Plan repeat echo in 1 month. Doppler q shift and daily NST.   - NICU consult yesterday. Discussion about when to intervene on baby A knowing intervention puts baby B at significant risk. Currenly planning intervention in an emergency after 25 wks.   - cervical incompetence. Repeat CL u/s tomorrow. Vaginal exam deferred as risk of introducing infection and would not change management at this point. Pt doing vaginal prometrium and is s/p 24 hrs Mag Sulfate and 48 hrs Indocin and BMZ. If pt starts feeling increased irritability or contractions, or  if worsening cervical length, will consider procardia.   - HA improved.    Lev Cervone A. 06/28/2015 10:52 AM

## 2015-06-29 ENCOUNTER — Inpatient Hospital Stay (HOSPITAL_COMMUNITY): Payer: BLUE CROSS/BLUE SHIELD

## 2015-06-29 MED ORDER — DOCUSATE SODIUM 100 MG PO CAPS
100.0000 mg | ORAL_CAPSULE | Freq: Two times a day (BID) | ORAL | Status: DC
  Administered 2015-06-29 – 2015-07-27 (×54): 100 mg via ORAL
  Filled 2015-06-29 (×53): qty 1

## 2015-06-29 NOTE — Progress Notes (Addendum)
Arrhythmia audible during EFM, FHR 55-92 during arrhythmia  Fetal movement audible. EFM tracing reviewed by Dr. Juliene PinaMody per RN request.

## 2015-06-29 NOTE — Progress Notes (Addendum)
FHT dopplered hand holding U/S transducer for 5 mniutes.  Twin A's arrhythmia heard during auscultation.

## 2015-06-29 NOTE — Progress Notes (Signed)
Hospital day #8 Cervical incompetence Donor egg IVF di-di twin pregnancy Fetal arrhythmia baby A- benign PACs 24'[redacted] wks gestation  Subjective: Patient notes good fetal movement. Feels well today.  IV fluids stopped. Still up to void often. Pt notes no contractions or cramping.   No leakage of fluid, no vaginal bleeding.  Patient is tolerating vaginal Prometrium.  Objective:  Filed Vitals:   06/29/15 1614 06/29/15 1955 06/29/15 2110 06/29/15 2220  BP: 104/58 107/62    Pulse: 107 101    Temp: 98.4 F (36.9 C) 98 F (36.7 C)    TempSrc: Oral Oral    Resp: 18 18 18 18   Height:      Weight:      SpO2:        Gen.: Well-appearing, no distress Abdomen: Gravid, nontender, no right upper quadrant pain, no uterine tightening GU: cvx short and soft, palpates about 1cm in length, fetal vtx felt through thin lower segment, mid position, cvx closed Lower extremity: Nontender, no edema  NST A: audible irreg rhythym. Spending most time in 90's, up to 150's occasionally, down to 50's for short time periods. good variability. B: 150's, + 10x10 accels, no decels,  Tocometry: irritability with no discreet contractions.   Group B strep negative  Assessment and plan: 40 year old G5 P1 031 at 1954w0d  days admitted for cervical incompetence and further evaluation of fetal arrhythmia.  - fetal arrhythmia. Likely benign PACs, may present risk of hydrops if FH staying in 50-80's for prolonged periods (several days).  No hydrops and FH still with abnl rhythm on u/s 3/27. Plan repeat echo in 1 month. Doppler q shift and daily NST.   - NICU consult completed. Discussion about when to intervene on baby A knowing intervention puts baby B at significant risk. Currenly planning intervention in an emergency after 25 wks.   - cervical incompetence. Stable CL, 1.2 cm closed portion on 3/27 though still palpates soft. Pt doing vaginal prometrium and is s/p 24 hrs Mag Sulfate and 48 hrs Indocin and BMZ. If pt  starts feeling increased irritability or contractions, or if worsening cervical length, will consider procardia. Recc continued bed rest at this time. Optios for inpt vs outpt management d/w pt. Will keep in house for now due to early GA.   - HA improved.    Laterrian Hevener A. 06/29/2015 10:58 PM

## 2015-06-30 NOTE — Progress Notes (Signed)
Hospital day #9 Cervical incompetence Donor egg IVF di-di twin pregnancy Fetal arrhythmia baby A- benign PACs 24'[redacted] wks gestation  Subjective: Patient notes good fetal movement. Feels well today.  IV fluids stopped. Still up to void often. Pt notes no contractions or cramping.   No leakage of fluid, no vaginal bleeding.  Patient is tolerating vaginal Prometrium.  Objective:  Filed Vitals:   06/29/15 1955 06/29/15 2110 06/29/15 2220 06/30/15 0745  BP: 107/62   111/63  Pulse: 101   97  Temp: 98 F (36.7 C)   97.8 F (36.6 C)  TempSrc: Oral   Oral  Resp: 18 18 18 18   Height:      Weight:      SpO2:    97%    Gen.: Well-appearing, no distress Abdomen: Gravid, nontender, no right upper quadrant pain, no uterine tightening GU: cvx short and soft, palpates about 1cm in length, fetal vtx felt through thin lower segment, mid position, cvx closed Lower extremity: Nontender, no edema   A: audible irreg rhythym. Spending most time in 90's, good variability. B: 150's, good variability.  Tocometry: irritability with no discreet contractions.   Group B strep negative  Assessment and plan: 40 year old G5 P1 031 at 7074w1d  days admitted for cervical incompetence and further evaluation of fetal arrhythmia.  - fetal arrhythmia. Likely benign PACs, may present risk of hydrops if FH staying in 50-80's for prolonged periods (several days).  No hydrops and FH still with abnl rhythm on u/s 3/27. Plan repeat echo in 1 month. Doppler q shift and daily NST.   - NICU consult completed. Discussion about when to intervene on baby A knowing intervention puts baby B at significant risk. Currenly planning intervention in an emergency after 25 wks.   - cervical incompetence. Stable CL, 1.2 cm closed portion on 3/27 though still palpates soft. Pt doing vaginal prometrium and is s/p 24 hrs Mag Sulfate and 48 hrs Indocin and BMZ. If pt starts feeling increased irritability or contractions, or if worsening  cervical length, will consider procardia. Recc continued bed rest at this time. Optios for inpt vs outpt management d/w pt. Will keep in house for now due to early GA. If no further cervical change in another week, Mon at 25'0, will d/c to home on bedrest.     Alonie Gazzola A. 06/30/2015 1:04 PM

## 2015-07-01 LAB — TYPE AND SCREEN
ABO/RH(D): O POS
ANTIBODY SCREEN: NEGATIVE

## 2015-07-01 NOTE — Progress Notes (Signed)
Hospital day #10 Cervical incompetence Donor egg IVF di-di twin pregnancy Fetal arrhythmia baby A- benign PACs 24'[redacted] wks gestation  Subjective: Patient notes good fetal movement. Feels well today.  IV fluids stopped. Still up to void often- q 1 hr. Pt notes no contractions or cramping.   No leakage of fluid, no vaginal bleeding.  Patient is tolerating vaginal Prometrium.  Objective:  Filed Vitals:   07/01/15 0810 07/01/15 1059 07/01/15 1212 07/01/15 1607  BP: 102/60  120/72 120/82  Pulse: 102  107 116  Temp: 97.9 F (36.6 C)  98.1 F (36.7 C) 98.1 F (36.7 C)  TempSrc: Oral  Oral Oral  Resp: 15  15 16   Height:      Weight:  59.92 kg (132 lb 1.6 oz)    SpO2:        Gen.: Well-appearing, no distress Abdomen: Gravid, nontender, no right upper quadrant pain, no uterine tightening GU: deferrd today: last exam 3/27:  cvx short and soft, palpates about 1cm in length, fetal vtx felt through thin lower segment, mid position, cvx closed Lower extremity: Nontender, no edema   NST: A: audible irreg rhythym. Spending most time in 90's, baseline 90s, + accels,  good variability. B: 150's, good variability, no decels Tocometry: irritability with no discreet contractions.   Group B strep negative  Assessment and plan: 40 year old G5 P1 031 at 6034w2d  days admitted for cervical incompetence and further evaluation of fetal arrhythmia.  - fetal arrhythmia. Likely benign PACs, may present risk of hydrops if FH staying in 50-80's for prolonged periods (several days).  No hydrops and FH still with abnl rhythm on u/s 3/27. Plan repeat echo in 1 month. Doppler q shift and daily NST.   - NICU consult completed. Discussion about when to intervene on baby A knowing intervention puts baby B at significant risk. Currenly planning intervention in an emergency after 25 wks.   - cervical incompetence. Stable CL, 1.2 cm closed portion on 3/27 though still palpates soft. Pt doing vaginal prometrium and is s/p  24 hrs Mag Sulfate and 48 hrs Indocin and BMZ. If pt starts feeling increased irritability or contractions, or if worsening cervical length, will consider procardia. Recc continued bed rest at this time. Optios for inpt vs outpt management d/w pt. Will keep in house for now due to early GA. If no further cervical change in another week, Mon at 25'0, will d/c to home on bedrest.   - Urinary frequency, bladder retraining d/w pt.    Bethany Eldredge A. 07/01/2015 5:33 PM

## 2015-07-02 MED ORDER — POLYETHYLENE GLYCOL 3350 17 G PO PACK
17.0000 g | PACK | Freq: Every day | ORAL | Status: DC | PRN
  Administered 2015-07-02 – 2015-07-27 (×5): 17 g via ORAL
  Filled 2015-07-02 (×5): qty 1

## 2015-07-02 NOTE — Progress Notes (Signed)
Hospital day #11 Cervical incompetence Donor egg IVF di-di twin pregnancy Fetal arrhythmia baby A- benign PACs 24'[redacted] wks gestation  Subjective: Patient notes good fetal movement. Feels well today.  IV fluids stopped. Still up to void often- q 1 hr but trying to increase bladder capacity.  Pt notes no contractions or cramping.   No leakage of fluid, no vaginal bleeding.  Patient is tolerating vaginal Prometrium.  Objective:  Filed Vitals:   07/02/15 0936 07/02/15 1248 07/02/15 1345 07/02/15 1719  BP: 110/62   116/65  Pulse: 105  94 88  Temp: 98.2 F (36.8 C) 98.2 F (36.8 C)  97.6 F (36.4 C)  TempSrc: Oral     Resp: 20 18  16   Height:      Weight:      SpO2: 99%       Gen.: Well-appearing, no distress Abdomen: Gravid, nontender, no right upper quadrant pain, no uterine tightening GU: deferrd today: last exam 3/27:  cvx short and soft, palpates about 1cm in length, fetal vtx felt through thin lower segment, mid position, cvx closed Lower extremity: Nontender, no edema   NST: A: audible irreg rhythym. Spending  1/2 time 90's, 1/2 time at 140s + accels,  good variability. B: 150's, good variability, no decels Tocometry: irritability with no discreet contractions.   Group B strep negative  Assessment and plan: 40 year old G5 P1 031 at 7269w3d  days admitted for cervical incompetence and further evaluation of fetal arrhythmia.  - fetal arrhythmia. Likely benign PACs, may present risk of hydrops if FH staying in 50-80's for prolonged periods (several days).  No hydrops and FH still with abnl rhythm on u/s 3/27. Plan repeat echo in 1 month. Doppler q shift and daily NST.   - NICU consult completed. Discussion about when to intervene on baby A knowing intervention puts baby B at significant risk. Currenly planning intervention in an emergency after 25 wks.   - cervical incompetence. Stable CL, 1.2 cm closed portion on 3/27 though still palpates soft. Pt doing vaginal prometrium and is  s/p 24 hrs Mag Sulfate and 48 hrs Indocin and BMZ. If pt starts feeling increased irritability or contractions, or if worsening cervical length, will consider procardia. Recc continued bed rest at this time. Optios for inpt vs outpt management d/w pt. Will keep in house for now due to early GA. If no further cervical change in another week, Mon at 25'0, will consider d/c to home on bedrest.      Jaron Czarnecki A. 07/02/2015 5:50 PM

## 2015-07-03 NOTE — Progress Notes (Signed)
Hospital day #12 Cervical incompetence Donor egg IVF di-di twin pregnancy Fetal arrhythmia baby A- benign PACs 24'[redacted] wks gestation  Subjective: Patient notes good fetal movement. Feels well today.  Pt notes no contractions or cramping.   No leakage of fluid, no vaginal bleeding.  Patient is tolerating vaginal Prometrium.  Objective:  Filed Vitals:   07/02/15 1950 07/03/15 0808 07/03/15 1125 07/03/15 1642  BP: 112/60 110/63 108/61 123/64  Pulse: 93 92 107 104  Temp: 97.9 F (36.6 C) 96.6 F (35.9 C) 98.2 F (36.8 C) 98.2 F (36.8 C)  TempSrc: Oral Axillary Oral Oral  Resp: 18 15 15 15   Height:      Weight:      SpO2:        Gen.: Well-appearing, no distress Abdomen: Gravid, nontender, no right upper quadrant pain, no uterine tightening GU: deferrd today: last exam 3/27:  cvx short and soft, palpates about 1cm in length, fetal vtx felt through thin lower segment, mid position, cvx closed Lower extremity: Nontender, no edema   NST: A: audible irreg rhythym. Spending  most time with baseline irregular in 90's,  Short episodes with baseline at 140 And minimal time in 50s., good variability B: 150's, good variability, no decels Tocometry: irritability with no discreet contractions.   Group B strep negative  Assessment and plan: 40 year old G5 P1 031 at 3431w4d  days admitted for cervical incompetence and further evaluation of fetal arrhythmia.  - fetal arrhythmia. Likely benign PACs, may present risk of hydrops if FH staying in 50-80's for prolonged periods (weeks).  No hydrops and FH still with abnl rhythm on u/s 3/27. Repeat weekly.  Plan repeat echo in 1 month. Doppler q shift and daily NST.   - NICU consult completed. Discussion about when to intervene on baby A knowing intervention puts baby B at significant risk. Currenly planning intervention in an emergency after 25 wks.   - cervical incompetence. Stable CL, 1.2 cm closed portion on 3/27 though still palpates soft. Pt  doing vaginal prometrium and is s/p 24 hrs Mag Sulfate and 48 hrs Indocin and BMZ. If pt starts feeling increased irritability or contractions, or if worsening cervical length, will consider procardia. Recc continued bed rest at this time. Optios for inpt vs outpt management d/w pt. Will keep in house for now due to early GA. If no further cervical change in another week, Mon at 25'0, will consider d/c to home on bedrest pending CL and SVE.      Sharhonda Atwood A. 07/03/2015 8:45 PM

## 2015-07-03 NOTE — Progress Notes (Signed)
Initial Nutrition Assessment  DOCUMENTATION CODES:   Not applicable  INTERVENTION:  Regular diet, snacks prn  NUTRITION DIAGNOSIS:   Increased nutrient needs related to  (pregnancy and fetal growth requirements) as evidenced by  (24 weeks, twin IUP).  GOAL:   Patient will meet greater than or equal to 90% of their needs  MONITOR:   Weight trends  REASON FOR ASSESSMENT:  Antenatal   ASSESSMENT:   24 4/7 weeks, cercical incomp. overall 20 Lb weight gain. Pre-preg BMI 21.3. Weight gain goal > 35 lbs. Reports diet tolerated well, not huge appetite but makes herself eat three meals    Diet Order:  Diet regular Room service appropriate?: Yes; Fluid consistency:: Thin  Skin:  Reviewed, no issues   Height:   Ht Readings from Last 1 Encounters:  06/22/15 5\' 1"  (1.549 m)    Weight:   Wt Readings from Last 1 Encounters:  07/01/15 132 lb 1.6 oz (59.92 kg)    Ideal Body Weight:    105 lbs  BMI:  Body mass index is 24.97 kg/(m^2).  Estimated Nutritional Needs:   Kcal:  1800-2000  Protein:  95-105 g  Fluid:  2.2 L  EDUCATION NEEDS:   No education needs identified at this time  Inez PilgrimKatherine Koua Deeg M.Odis LusterEd. R.D. LDN Neonatal Nutrition Support Specialist/RD III Pager [phone]      Phone [phone]

## 2015-07-04 LAB — TYPE AND SCREEN
ABO/RH(D): O POS
ANTIBODY SCREEN: NEGATIVE

## 2015-07-04 MED ORDER — SODIUM CHLORIDE 0.9% FLUSH: 3.0000 mL | INTRAVENOUS | Status: DC | PRN

## 2015-07-04 NOTE — Progress Notes (Signed)
Baby A varies between 50's 80's and 110' s   Moderate variability with 110's and absent with lower heart rates

## 2015-07-04 NOTE — Progress Notes (Signed)
Baby B traced as A in 150's  Baby A traced as B baby in 4050's  B's cardio removed and A traced in 50's then in 140s with moderate variability

## 2015-07-04 NOTE — Progress Notes (Signed)
Baby A tracing as Baby B on monitor

## 2015-07-04 NOTE — Progress Notes (Signed)
Hospital day #13 Cervical incompetence Donor egg IVF di-di twin pregnancy Fetal arrhythmia baby A- benign PACs 24'[redacted] wks gestation  Subjective: Patient notes good fetal movement. Feels well today.  Pt notes no contractions or cramping.   No leakage of fluid, no vaginal bleeding.  Patient is tolerating vaginal Prometrium.  Objective:  Filed Vitals:   07/04/15 1616 07/04/15 1655 07/04/15 1709 07/04/15 1944  BP:    103/61  Pulse:   98 100  Temp:    98.2 F (36.8 C)  TempSrc:    Oral  Resp: 16 20  18   Height:      Weight:      SpO2:        Gen.: Well-appearing, no distress Abdomen: Gravid, nontender, no right upper quadrant pain, no uterine tightening GU: deferrd today: last exam 3/27:  cvx short and soft, palpates about 1cm in length, fetal vtx felt through thin lower segment, mid position, cvx closed Lower extremity: Nontender, no edema   NST: A: audible irreg rhythym. Spending  About 1/2 time time with baseline irregular in 90's,  1/4 time at 110's at 1/4 time in 50's over 30 min NST. Good variability B: 145s, good variability, no decels Tocometry:slight increase in  irritability   Group B strep negative  Assessment and plan: 40 year old G5 P1 031 at 4244w5d  days admitted for cervical incompetence and further evaluation of fetal arrhythmia.  - fetal arrhythmia. Likely benign PACs, may present risk of hydrops if FH staying in 50-80's for prolonged periods (weeks).  No hydrops and FH still with abnl rhythm on u/s 3/27. Repeat weekly.  Plan repeat echo in 1 month. Doppler q shift and daily NST.   - NICU consult completed. Discussion about when to intervene on baby A knowing intervention puts baby B at significant risk. Currenly planning intervention in an emergency after 25 wks.   - cervical incompetence. Stable CL, 1.2 cm closed portion on 3/27 though still palpates soft. Pt doing vaginal prometrium and is s/p 24 hrs Mag Sulfate and 48 hrs Indocin and BMZ. If pt starts feeling  increased irritability or contractions, or if worsening cervical length, will consider procardia. Recc continued bed rest at this time. Optios for inpt vs outpt management d/w pt. Will keep in house for now due to early GA. If no further cervical change in another week, Mon at 25'0, will consider d/c to home on bedrest pending CL and SVE.      Lyne Khurana A. 07/04/2015 11:39 PM

## 2015-07-04 NOTE — Progress Notes (Signed)
Pt returned to room  

## 2015-07-04 NOTE — Progress Notes (Signed)
Pt up for w/c ride with husband

## 2015-07-05 NOTE — Progress Notes (Signed)
Hospital day #14 Cervical incompetence Donor egg IVF di-di twin pregnancy Fetal arrhythmia baby A- benign PACs 24 6/[redacted] wks gestation  Subjective: Patient notes good fetal movement. Feels well today.  Pt notes no contractions or cramping.   No leakage of fluid, no vaginal bleeding.  Patient is tolerating vaginal Prometrium.  Objective:  Filed Vitals:   07/04/15 1709 07/04/15 1944 07/05/15 0554 07/05/15 0904  BP:  103/61 98/54 102/54  Pulse: 98 100 105 103  Temp:  98.2 F (36.8 C) 98.5 F (36.9 C) 98 F (36.7 C)  TempSrc:  Oral Oral Oral  Resp:  18 20 20   Height:      Weight:      SpO2:        Gen.: Well-appearing, no distress HEENT: nl Neck: supple with FROM Chest: nl respirations noted Abdomen: Gravid, nontender, no right upper quadrant pain, no uterine tightening GU: deferred today: last exam 3/27:  cvx short and soft, palpates about 1cm in length, fetal vtx felt through thin lower segment, mid position, cvx closed Lower extremity: Nontender, no edema Neuro: nonfocal Skin : intact   NST: A: audible irreg rhythm. Spending  About 1/2 time time with baseline irregular in 90's,  1/4 time at 110's at 1/4 time in 50's over 30 min NST. Good variability B: 145s, good variability, no decels Tocometry:slight increase in  irritability   Group B strep negative  Assessment and plan: 92102w6d  Days IUP . 1- DIDI twin gestation. Concordant growth reported  2-fetal arrhythmia twin A.. Likely benign PACs, may present risk of hydrops if FH staying in 50-80's for prolonged periods (weeks).  No hydrops and FH still with abnl rhythm on u/s 3/27. Repeat weekly.  Plan repeat echo in 1 month. Doppler q shift and daily NST.    3- Cervical insufficiency-Stable CL, 1.2 cm closed portion on 3/27 though still palpates soft(Dr. F). Pt doing vaginal prometrium and is s/p 24 hrs Mag Sulfate and 48 hrs Indocin and BMZ. If pt starts feeling increased irritability or contractions, or if worsening cervical  length, will consider procardia. Recc continued bed rest at this time. Optios for inpt vs outpt management d/w pt. Who prefers to stay in house.  If no further cervical change on Mon at 25'0, Dr. Ernestina PennaFogleman will discuss d/c to home on bedrest pending CL and SVE.      Jediah Horger J 07/05/2015 10:49 AM

## 2015-07-05 NOTE — Progress Notes (Signed)
Twin A's arrhythmia heard throughout  dopplering FHT's.  FHR noted @ 140bpm for approx 40 seconds during 7 minutes of auscultating.

## 2015-07-05 NOTE — Progress Notes (Addendum)
Arrhythmia heard while doing NST, FHR heard between 85-150 bpm @ times.

## 2015-07-06 ENCOUNTER — Inpatient Hospital Stay (HOSPITAL_COMMUNITY): Payer: BLUE CROSS/BLUE SHIELD

## 2015-07-06 NOTE — Progress Notes (Signed)
Hospital day #15 Cervical incompetence Donor egg IVF di-di twin pregnancy Fetal arrhythmia baby A- benign PACs 25'[redacted] wks gestation  Subjective: Patient notes good fetal movement. Feels well today.  Pt notes no contractions or cramping.   No leakage of fluid, no vaginal bleeding.  Patient is tolerating vaginal Prometrium.  Objective:  Filed Vitals:   07/06/15 0742 07/06/15 1610 07/06/15 1928 07/06/15 1929  BP: 109/55 119/63  97/63  Pulse: 96 97  100  Temp: 98.1 F (36.7 C) 98.4 F (36.9 C) 98.3 F (36.8 C)   TempSrc: Oral Oral Oral   Resp: 16 16  18   Height:      Weight:      SpO2:        Gen.: Well-appearing, no distress Abdomen: Gravid, nontender, no right upper quadrant pain, no uterine tightening GU:  cvx short and soft, palpates about 1cm in length, mid position, cvx closed Lower extremity: Nontender, no edema  NST: not recorded today Toco: irritability  U/s 07/06/15: cvx 1.6 cm closed portion, vtx/vtx, arhythmia fetus A, nl FHR baby B   Group B strep negative  Assessment and plan: 40 year old G5 P1 031 at 4342w0d  days admitted for cervical incompetence and further evaluation of fetal arrhythmia.  - fetal arrhythmia. Likely benign PACs, small risk of hydrops. Repeat weekly u/s to evaluate rhythm and to r/o hydrops.  Plan repeat echo in 1 mont from last. Doppler q shift and daily NST. Growth u/s q 3 weeks.   - NICU consult completed.  - cervical incompetence. Stable CL, 1.6 cm closed portion on 3/27 and again on 4/3 though still palpates soft. Pt doing vaginal prometrium and is s/p 24 hrs Mag Sulfate and 48 hrs Indocin and BMZ. If pt starts feeling increased irritability or contractions, or if worsening cervical length, will consider procardia. Recc continued bed rest at this time due to early gestational age.  Optios for inpt vs outpt management d/w pt. Will keep in house for now due to early GA.   -Dispo. Due to early GA, high risks to premature twins, will plan  continued in-house bedrest. If cervix remains stable, will plan d/c to home at 28 wks after DS and 1 hr GTT. Will repeat CL by u/s and vaginal exam prior to d/c.      Sharifah Champine A. 07/06/2015 9:26 PM

## 2015-07-07 NOTE — Progress Notes (Addendum)
Hospital day #16 Cervical incompetence Donor egg IVF di-di twin pregnancy Fetal arrhythmia baby A- benign PACs 25.[redacted] wks gestation  Subjective: No complaints. No UCs/ pelvic pressure. Good FMs.   Objective: BP 113/62 mmHg  Pulse 105  Temp(Src) 98.5 F (36.9 C) (Oral)  Resp 16  Ht 5\' 1"  (1.549 m)  Wt 132 lb 1.6 oz (59.92 kg)  BMI 24.97 kg/m2  SpO2 97%  LMP 12/25/2014 (Exact Date)  Gen.: Well-appearing, no distress Abdomen: Gravid, nontender, no right upper quadrant pain, no uterine tightening GU:  cvx short and soft, palpates about 1cm in length, mid position, cx closed Lower extremity: Nontender, no edema  NST: Twins - reactive.  Toco: irritability  U/s 07/06/15: cvx 1.6 cm closed portion, vtx/vtx, arhythmia fetus A, nl FHR baby B   Group B strep negative  Assessment and plan:  40 year old G5 P1 031 at 25w 1d  days admitted for cervical incompetence and further evaluation of fetal arrhythmia.  - fetal arrhythmia. Likely benign PACs, small risk of hydrops. Repeat weekly u/s to evaluate rhythm and to r/o hydrops.  Plan repeat echo in 1 mont from last. Doppler q shift and daily NST. Growth u/s q 3 weeks.   - NICU consult completed.  - cervical incompetence. Stable CL, 1.6 cm closed portion on 3/27 and again on 4/3 though still palpates soft (per Dr Ernestina PennaFogleman). Vaginal Prometrium.  S/p 24 hrs Mag Sulfate and 48 hrs Indocin and BMZ. Add Procardia if needed.   Recc continued bed rest in hospital at this time due to early gestational age. Remain in-house.   -Dispo. Due to early GA, high risks to premature twins, will plan continued in-house bedrest. If cervix remains stable, will plan d/c to home at 28 wks after DS and 1 hr GTT. Will repeat CL by u/s and vaginal exam prior to d/c.   Latrell Potempa R 07/07/2015

## 2015-07-08 NOTE — Progress Notes (Signed)
Bethany DelMarie-Lyne Walda Hertzog MD  Hospital day #17 Cervical incompetence Donor egg IVF di-di twin pregnancy Fetal arrhythmia baby A- benign PACs 25 2/[redacted] wks gestation  Subjective: No complaints. No UCs/ pelvic pressure. Good FMs.   Objective:   Vital Signs   Report       04/04 0700 04/05 0659 04/05 0700 04/05 0938   Most Recent       Temp (F)    98.1-98.5 97.8  97.8 (36.6)  04/05 0920   Pulse Rate    101-105 101   101  04/05 0920   Resp    16-20 18  18   04/05 0920   BP    100/61-120/68 110/60  110/60  04/05 0920   SpO2 (%)   95  95  04/05 0920        LMP 12/25/2014 (Exact Date)  Gen.: Well-appearing, no distress Abdomen: Gravid, nontender, no right upper quadrant pain, no uterine tightening GU: cvx short and soft, palpates about 1cm in length, mid position, cx closed Lower extremity: Nontender, no edema  NST: A- 90's no deceleration (Dysrhythmia, benign PACs)          B- 140's no deceleration Toco: No UCs.  U/s 07/06/15: cvx 1.6 cm closed portion, vtx/vtx, arhythmia fetus A, nl FHR baby B   Group B strep negative  Assessment and plan:  40 year old G5 P1 031 at 25w 2d days admitted for cervical incompetence and further evaluation of fetal arrhythmia.  - fetal arrhythmia. Likely benign PACs, small risk of hydrops. Repeat weekly u/s to evaluate rhythm and to r/o hydrops. Plan repeat echo in 1 mont from last. Doppler q shift and daily NST. Growth u/s q 3 weeks.   - NICU consult completed.  - cervical incompetence. Stable CL, 1.6 cm closed portion on 3/27 and again on 4/3 though still palpates soft (per Dr Ernestina PennaFogleman). Vaginal Prometrium.  S/p 24 hrs Mag Sulfate and 48 hrs Indocin and BMZ. Add Procardia if needed.  Recc continued bed rest in hospital at this time due to early gestational age. Remain in-house.   -Dispo. Due to early GA, high risks to premature twins, will plan continued in-house bedrest. If cervix remains stable, will plan d/c to home at 28 wks  after DS and 1 hr GTT. Will repeat CL by u/s and vaginal exam prior to d/c.  Bethany DelMarie-Lyne [name] MD  07/08/2015 at 10:03 am

## 2015-07-09 NOTE — Progress Notes (Signed)
Fetuses active & movement audible

## 2015-07-09 NOTE — Progress Notes (Signed)
Hospital day #17 Cervical insufficiency Donor egg IVF di-di twin pregnancy Fetal arrhythmia baby A- benign PACs 25 3/[redacted] wks gestation  Subjective: Patient notes good fetal movement. Feels well today.  Pt notes no contractions or cramping.   No leakage of fluid, no vaginal bleeding.  Patient is tolerating vaginal Prometrium.  Objective:  Filed Vitals:   07/08/15 1551 07/08/15 1957 07/08/15 2146 07/09/15 0810  BP: 110/59 110/60  104/65  Pulse: 96 111 101 98  Temp: 98.4 F (36.9 C) 98.3 F (36.8 C)  98.3 F (36.8 C)  TempSrc:  Oral  Oral  Resp: 18 20  20   Height:      Weight:      SpO2: 98%       Gen.: Well-appearing, no distress HEENT: nl Neck: supple with FROM Chest: nl respirations noted Abdomen: Gravid, nontender, no right upper quadrant pain, no uterine tightening GU: deferred today: last exam 3/27:  cvx short and soft, palpates about 1cm in length, fetal vtx felt through thin lower segment, mid position, cvx closed Lower extremity: Nontender, no edema Neuro: nonfocal Skin : intact   NST: A: audible irreg rhythm. Spending  About 1/2 time time with baseline irregular in 90's,  1/4 time at 110's at 1/4 time in 50's over 30 min NST. Good variability B: 145s, good variability, no decels Tocometry:slight irritability   Group B strep negative  Assessment and plan: 6828w3d  Days IUP . 1- DIDI twin gestation.  -Concordant growth reported  2-Fetal bradyarrhythmia twin A.. - Likely benign PACs, may present risk of hydrops if FH staying in 50-80's for prolonged periods (weeks).  No hydrops and FH still with abnl rhythm on u/s 3/27. Repeat weekly.  Plan repeat echo in 1 month. Doppler q shift and daily NST.    3- Cervical insufficiency -Stable CL, 1.6cm closed portion  though still palpates soft(Dr. F). Pt doing vaginal prometrium and is s/p 24 hrs Mag Sulfate and 48 hrs Indocin and BMZ. If pt starts feeling increased irritability or contractions, or if worsening cervical  length, will consider procardia. Recommend continued bed rest at this time.      Shanequa Whitenight J 07/09/2015 10:09 AM

## 2015-07-10 NOTE — Progress Notes (Signed)
Hospital day #19 Cervical incompetence Donor egg IVF di-di twin pregnancy Fetal arrhythmia baby A- benign PACs 25'[redacted] wks gestation  Subjective: Patient notes good fetal movement. Feels well today.  Pt notes no contractions or cramping.   No leakage of fluid, no vaginal bleeding.  Patient is tolerating vaginal Prometrium.  Objective:  Filed Vitals:   07/10/15 0755 07/10/15 1150 07/10/15 1154 07/10/15 1605  BP: 103/63  108/63 111/63  Pulse: 100 111 104 97  Temp: 97.7 F (36.5 C)  97.9 F (36.6 C) 98.1 F (36.7 C)  TempSrc: Oral  Oral Oral  Resp: 18  20 18   Height:      Weight:      SpO2:  98%      Gen.: Well-appearing, no distress Abdomen: Gravid, nontender, no right upper quadrant pain, no uterine tightening GU: SVE 4/3: cvx short and soft, palpates about 1cm in length, mid position, cvx closed Lower extremity: Nontender, no edema  NST: A: 150's, + 10x10 accels, rare sharp variable decels, good variability, B: 145's, + 10c10 accels, no decels, 10 beat var Toco: irritability  U/s 07/06/15: cvx 1.6 cm closed portion, vtx/vtx, arhythmia fetus A, nl FHR baby B   Group B strep negative  Assessment and plan: 40 year old G5 P1 031 at 3857w4d  days admitted for cervical incompetence and further evaluation of fetal arrhythmia.  - fetal arrhythmia. Likely benign PACs, small risk of hydrops. Repeat weekly u/s to evaluate rhythm and to r/o hydrops.  Plan repeat echo in 1 mont from last. Doppler q shift and daily NST. Growth u/s q 3 weeks.   - NICU consult completed.  - cervical incompetence. Stable CL, 1.6 cm closed portion on 3/27 and again on 4/3 though still palpates soft. Pt doing vaginal prometrium and is s/p 24 hrs Mag Sulfate and 48 hrs Indocin and BMZ. If pt starts feeling increased irritability or contractions, or if worsening cervical length, will consider procardia. Recc continued bed rest at this time due to early gestational age.  Optios for inpt vs outpt management d/w pt.  Will keep in house for now due to early GA.   -Dispo. Due to early GA, high risks to premature twins, will plan continued in-house bedrest. If cervix remains stable, will plan d/c to home at 28 wks after DS and 1 hr GTT. Will repeat CL by u/s and vaginal exam prior to d/c.      Johana Hopkinson A. 07/10/2015 5:18 PM

## 2015-07-11 NOTE — Progress Notes (Signed)
25 5/7 weeks IVF di-di twins Fetal arrhythmia Twin A( PAC's) Cervical incompetence   HD # 20  S: no complaints. Active fetuses. Denies ctx ? Related to additional folic acid  BP 112/67 mmHg  Pulse 100  Temp(Src) 98.1 F (36.7 C) (Oral)  Resp 16  Ht 5\' 1"  (1.549 m)  Wt 134 lb 14.4 oz (61.19 kg)  BMI 25.50 kg/m2  SpO2 98%  LMP 12/25/2014 (Exact Date)  Lungs clear to A Cor RRR Abdomen gravid soft nontender Pelvic deferred Extr. No stockings. No calf tenderness or edema  Tracing: twin A> baseline 150 small variables Twin B baseline 145  No ctx  IMP: cervical incompetence. On vag prometrium  S/p BMZ, Mag neuroprophylaxis Fetal arrhythmia .  Twin gestation ( Di-DI) @ 25 5/7 weeks P) cont with present inpt mgmt. Bed rest exercises given. Monthly fetal growth

## 2015-07-12 NOTE — Progress Notes (Signed)
25 6/7 weeks IVF di-di twins Fetal arrhythmia Twin A( PAC's) Cervical incompetence   HD # 21  S: no complaints. Active fetuses. Denies ctx BP 103/64 mmHg  Pulse 108  Temp(Src) 98.1 F (36.7 C) (Oral)  Resp 16  Ht 5\' 1"  (1.549 m)  Wt 134 lb 14.4 oz (61.19 kg)  BMI 25.50 kg/m2  SpO2 98%  LMP 12/25/2014 (Exact Date)  Lungs clear to A Cor RRR Abdomen gravid soft nontender Pelvic deferred Extr. No stockings. No calf tenderness or edema  Tracing: baby A; baseline 145 small variables Baby B: baseline 140 No ctx  IMP: cervical incompetence. Using vag prometrium  S/p BMZ, Mag neuroprophylaxis Fetal arrhythmia .  Twin gestation ( Di-DI) @ 25 6/7 weeks P) cont with present inpt mgmt. Reinforce need for use of SCD hose

## 2015-07-13 ENCOUNTER — Inpatient Hospital Stay (HOSPITAL_COMMUNITY): Payer: BLUE CROSS/BLUE SHIELD

## 2015-07-13 NOTE — Progress Notes (Signed)
Pt refused IV maintenance and toco/fetal monitoring until she eats breakfast. Discussed with pt need for both. Pt refused and educated.

## 2015-07-13 NOTE — Progress Notes (Signed)
Hospital day #22 Cervical insufficiency Donor egg IVF di-di twin pregnancy Fetal arrhythmia baby A- benign PACs 26 0/[redacted] wks gestation  Subjective: Patient notes good fetal movement. Feels well today.  Pt notes no contractions or cramping.   No leakage of fluid, no vaginal bleeding.  Patient is tolerating vaginal Prometrium.  Objective:  Filed Vitals:   07/12/15 1140 07/12/15 1731 07/12/15 2051 07/12/15 2303  BP: 103/64 108/62 106/53 107/62  Pulse: 108 98 103 100  Temp:  98.1 F (36.7 C) 98.1 F (36.7 C) 98.2 F (36.8 C)  TempSrc:   Oral Oral  Resp:  18 18 18   Height:      Weight:      SpO2:   100% 99%    Gen.: Well-appearing, no distress HEENT: nl Neck: supple with FROM Chest: nl respirations noted Abdomen: Gravid, nontender, no right upper quadrant pain, no uterine tightening GU: deferred today: last exam 3/27:  cvx short and soft, palpates about 1cm in length, fetal vtx felt through thin lower segment, mid position, cvx closed Lower extremity: Nontender, no edema Neuro: nonfocal Skin : intact   NST: A: audible irreg rhythm. Spending  About 1/2 time time with baseline irregular in 90's,  1/4 time at 110's at 1/4 time in 50's over 30 min NST. Good variability B: 145s, good variability, no decels Tocometry:slight irritability   Group B strep negative  Assessment and plan: 7243w0d  Days IUP . 1- DIDI twin gestation.  -Concordant growth reported  2-Fetal bradyarrhythmia twin A.. - Likely benign PACs, may present risk of hydrops if FH staying in 50-80's for prolonged periods (weeks).  No hydrops and FH still with abnl rhythm on u/s 3/27. Repeat weekly(today).  Plan repeat echo discussed. Doppler q shift and daily NST.    3- Cervical insufficiency -Stable CL, 1.6cm closed portion  though still palpates soft(Dr. F). Pt doing vaginal prometrium and is s/p 24 hrs Mag Sulfate and 48 hrs Indocin and BMZ. If pt starts feeling increased irritability or contractions, or if  worsening cervical length, will consider procardia. Recommend continued bed rest at this time.      Lenoard AdenAAVON,Bethany Mendoza 07/13/2015 6:38 AM

## 2015-07-14 ENCOUNTER — Other Ambulatory Visit (HOSPITAL_COMMUNITY): Payer: BLUE CROSS/BLUE SHIELD

## 2015-07-14 MED ORDER — ACYCLOVIR 5 % EX OINT
TOPICAL_OINTMENT | CUTANEOUS | Status: DC
  Administered 2015-07-14 – 2015-07-18 (×17): via TOPICAL
  Filled 2015-07-14: qty 15

## 2015-07-14 NOTE — Progress Notes (Signed)
26.1 wks twins Cervical insufficiency Donor egg IVF di-di twin pregnancy Fetal arrhythmia baby A- benign PACs  Subjective:  Patient notes good fetal movement. Feels well, denies contractions or cramping/leakage of fluid/ vaginal bleeding Cold sore noted, pt does have hx, has some pain now but is drying   Objective: BP 100/63 mmHg  Pulse 99  Temp(Src) 98.6 F (37 C) (Oral)  Resp 18  Ht 5\' 1"  (1.549 m)  Wt 134 lb 14.4 oz (61.19 kg)  BMI 25.50 kg/m2  SpO2 99%  LMP 12/25/2014 (Exact Date)  Gen.: Well-appearing, no distress HEENT: HSV cold sore right upper lip area  Chest: CTA bilat CV RRR Abdomen: Gravid, nontender, no right upper quadrant pain, no uterine tightening GU: deferred today: last exam 3/27:  Lower extremity: Nontender, no edema Neuro: nonfocal Skin : intact   NST: Both 135-140/ mod variab, + accels, no decels in tracing time. Both reactive  Group B strep negative  Assessment and plan: 7331w1d  DiDi twins, cervical insufficiency  . 1- DIDI twin gestation.  -Concordant growth last 4/10, continue q 4 wks   2-Fetal bradyarrhythmia twin A. - Likely benign PACs, may present risk of hydrops if FH staying in 50-80's for prolonged periods (weeks).  No hydrops and FH still with abnl rhythm on u/s 3/27. Repeat weekly (Mondays)  Plan repeat echo discussed. Doppler q shift and daily NST.  None noted on FHT last 24 hrs   3- Cervical insufficiency -Stable CL, 1.6cm closed portion  though still palpates soft(Dr. F). Pt doing vaginal prometrium and is s/p 24 hrs Mag Sulfate and 48 hrs Indocin and BMZ. If pt starts feeling increased irritability or contractions, or if worsening cervical length, will consider procardia. Recommend continued bed rest at this time.    Fairley Copher R 07/14/2015 7:52 AM

## 2015-07-15 NOTE — Progress Notes (Signed)
26 3/7 weeks IVF di-di twins concordant Fetal arrhythmia Twin A( PAC's) Cervical incompetence   HD # 24  S: no complaints. Active fetuses. Last bm 2 days ago  BP 124/62 mmHg  Pulse 101  Temp(Src) 97.8 F (36.6 C) (Oral)  Resp 16  Ht 5\' 1"  (1.549 m)  Wt 62.007 kg (136 lb 11.2 oz)  BMI 25.84 kg/m2  SpO2 99%  LMP 12/25/2014 (Exact Date)  Lungs clear to A Cor RRR Abdomen gravid soft nontender Pelvic deferred Extr. No stockings. No calf tenderness or edema  Tracing: baby A; baseline 140 Baby B: baseline  145 No ctx  IMP: cervical incompetence. Using vag prometrium  S/p BMZ x2, Mag neuroprophylaxis Fetal arrhythmia ( twin A).  Twin gestation ( Di-DI) @ 25 6/7 weeks P) miralax today. Cont inpt. Reiterate use of SCD stockings

## 2015-07-16 NOTE — Progress Notes (Signed)
Bethany Bethany DelLavoie MD 07/16/2015  26 3/7 wks twins Cervical insufficiency Donor egg IVF di-di twin pregnancy Fetal arrhythmia baby A- benign PACs  Subjective:  Patient notes good fetal movement. Feels well, denies contractions or cramping/leakage of fluid/ vaginal bleeding Cold sore noted, pt does have hx, has some pain now but is drying   Objective:  Vital Signs   Report       04/12 0700 04/13 0659 04/13 0700 04/13 0824   Most Recent       Temp (F)    97.8-98.5   98.3 (36.8)  04/12 2110   Pulse Rate    100-104    104  04/12 2110   Resp    16-18   18  04/12 2208   BP    120/62-124/62   120/62  04/12 2110   Weight (lb)  136   136 lb 11.2 oz (62.007 kg)  04/12 1002       Gen.: Well-appearing, no distress HEENT: HSV cold sore right upper lip area  Abdomen: Gravid, nontender, no right upper quadrant pain, no uterine tightening GU: deferred today: last exam 3/27:  Lower extremity: Nontender, no edema Neuro: nonfocal Skin : intact  NST yesterday: Both 135-140/ mod variab, + accels, no decels in tracing time. Both reactive NST pending today. Fetus A has had a normal base line FHR x a few days now. Group B strep negative  Assessment and plan: 5867w3d DiDi twins, cervical insufficiency  . 1- DIDI twin gestation.  -Concordant growth last 4/10, continue q 4 wks   2-Fetal bradyarrhythmia twin A. - Likely benign PACs, may present risk of hydrops if FH staying in 50-80's for prolonged periods (weeks). No hydrops and FH still with abnl rhythm on u/s 3/27. Repeat weekly (Mondays) Plan repeat echo discussed. Doppler q shift and daily NST. None noted on FHT last 24 hrs   3- Cervical insufficiency -Stable CL, 1.6cm closed portion though still palpates soft(Dr. F). Pt doing vaginal prometrium and is s/p 24 hrs Mag Sulfate and 48 hrs Indocin and BMZ. If pt starts feeling increased irritability or contractions, or if worsening cervical length, will consider  procardia. Recommend continued bed rest at this time.  Bethany DelMarie-Lyne Torrez Renfroe MD  07/16/2015 at 8:45 am

## 2015-07-17 NOTE — Progress Notes (Signed)
Hospital day #26 Cervical insufficiency Donor egg IVF di-di twin pregnancy Fetal arrhythmia baby A- benign PACs 26 4/[redacted] wks gestation  Subjective: Patient notes good fetal movement. Feels well today.  Pt notes no contractions or cramping.   No leakage of fluid, no vaginal bleeding.  Patient is tolerating vaginal Prometrium.  Objective:  Filed Vitals:   07/16/15 1546 07/16/15 1920 07/16/15 2050 07/16/15 2152  BP: 100/57 101/53    Pulse: 107 103    Temp: 98.3 F (36.8 C) 98.2 F (36.8 C)    TempSrc: Oral Oral    Resp: 16 16 16 16   Height:      Weight:      SpO2:        Gen.: Well-appearing, NAD HEENT: nl Neck: supple with FROM Chest: nl respirations noted CV: RRR Abdomen: Gravid, nontender, no right upper quadrant pain, no uterine tightening GU: deferred today: last exam 3/27:(Fogleman)  cvx short and soft, palpates about 1cm in length, fetal vtx felt through thin lower segment, mid position, cvx closed Lower extremity: Nontender, no edema Neuro: nonfocal Skin : intact   NST: A: audible irreg rhythm. Spending  About 1/3 time time with baseline irregular in 90's,  1/3 time at 110's at 1/3 time in 50's over 30 min NST. Good variability B: 145s, good variability, no decels Tocometry:slight irritability   Group B strep negative- will need rpt after 5 weeks  Assessment and plan: 5918w4d  Days IUP . 1- DIDI twin gestation.  -Concordant growth reported. NICU tour today if possible  2-Fetal bradyarrhythmia twin A.. - Likely benign PACs, may present risk of hydrops if FH staying in 50-80's for prolonged periods (weeks).  No hydrops and FH still with abnl rhythm on u/s 3/27. Repeat weekly(Monday).  Plan repeat echo discussed. Doppler q shift and daily NST.    3- Cervical insufficiency -Stable CL, 1.6cm closed portion  though still palpates soft(Dr. F). Pt doing vaginal prometrium and is s/p 24 hrs Mag Sulfate and 48 hrs Indocin and BMZ. If pt starts feeling increased irritability  or contractions, or if worsening cervical length, will consider procardia. Recommend continued bed rest at this time. Will need rpt MgSO4 if delivery imminent. Rescue dose BMZ discussed.      Dwain Huhn J 07/17/2015 7:56 AM

## 2015-07-18 NOTE — Progress Notes (Signed)
Hospital day # 27 Cervical insufficiency Donor egg IVF di-di twin pregnancy Fetal arrhythmia baby A- benign PACs 26 5/[redacted] wks gestation  Subjective: Patient notes good fetal movement. Feels well today.  Pt notes no contractions or cramping. No leakage of fluid, no vaginal bleeding.  Patient is tolerating vaginal Prometrium.  Objective: BP 96/59 mmHg  Pulse 114  Temp(Src) 97.9 F (36.6 C) (Oral)  Resp 16  Ht 5\' 1"  (1.549 m)  Wt 136 lb 11.2 oz (62.007 kg)  BMI 25.84 kg/m2  SpO2 99%  LMP 12/25/2014 (Exact Date)  Gen.: Well-appearing, NAD Chest: nl respirations noted CV: RRR Abdomen: Gravid, nontender, no right upper quadrant pain, no uterine tightening GU: deferred: last exam 3/27:(Fogleman)  cvx short and soft, palpates about 1cm in length, fetal vtx felt through thin lower segment, mid position, cvx closed Lower extremity: Nontender, no edema Neuro: nonfocal Skin : intact   NST: A: audible irreg rhythm. Spending  About 1/3 time time with baseline irregular in 90's,  1/3 time at 110's at 1/3 time in 50's over 30 min NST. Good variability B: 145s, good variability, no decels Tocometry:slight irritability   Group B strep negative- will need rpt after 5 weeks  Assessment and plan: 7954w5d  Days IUP . 1- DIDI twin gestation.  -Concordant growth 4/10. NICU tour today if possible  2-Fetal bradyarrhythmia twin A.. - Likely benign PACs, may present risk of hydrops if FH staying in 50-80's for prolonged periods (weeks).  No hydrops and FH still with abnl rhythm on u/s 3/27. Repeat weekly(Monday).  Plan repeat echo discussed. Doppler q shift and daily NST.    3- Cervical insufficiency -Stable CL, 1.6cm closed portion  though still palpates soft(Dr. F). Pt doing vaginal prometrium and is s/p 24 hrs Mag Sulfate and 48 hrs Indocin and BMZ. If pt starts feeling increased irritability or contractions, or if worsening cervical length, will consider procardia. Recommend continued bed rest at this  time. Will need rpt MgSO4 if delivery imminent. Rescue dose BMZ discussed.    Kimoni Pagliarulo R 07/18/2015 2:11 PM

## 2015-07-19 NOTE — Progress Notes (Signed)
Hospital day # 28 Cervical insufficiency Donor egg IVF di-di twin pregnancy Fetal arrhythmia baby A- benign PACs 26  6/[redacted] wks gestation  Subjective: Patient notes good fetal movement. Feels well today.  Pt notes no contractions or cramping. No leakage of fluid, no vaginal bleeding.  Patient is tolerating vaginal Prometrium.  Objective: BP 105/58 mmHg  Pulse 96  Temp(Src) 98 F (36.7 C) (Oral)  Resp 18  Ht 5\' 1"  (1.549 m)  Wt 136 lb 11.2 oz (62.007 kg)  BMI 25.84 kg/m2  SpO2 93%  LMP 12/25/2014 (Exact Date)  Gen.: Well-appearing, NAD Chest: nl respirations noted CV: RRR Abdomen: Gravid, nontender, no right upper quadrant pain, no uterine tightening GU: deferred: last exam 3/27:(Fogleman)  cvx short and soft, palpates about 1cm in length, fetal vtx felt through thin lower segment, mid position, cvx closed Lower extremity: Nontender, no edema Neuro: nonfocal Skin : intact   NST: A: audible irreg rhythm. Spending  About 1/3 time time with baseline irregular in 90's,  1/3 time at 110's at 1/3 time in 50's over 30 min NST. Good variability B: 145s, good variability, no decels Tocometry:slight irritability   Group B strep negative- will need rpt after 5 weeks  Assessment and plan: 26w 6d  Days IUP . 1- DIDI twin gestation.  -Concordant growth 4/10. NICU tour today if possible  2-Fetal bradyarrhythmia twin A.. - Likely benign PACs, may present risk of hydrops if FH staying in 50-80's for prolonged periods (weeks).  No hydrops and FH still with abnl rhythm on u/s 3/27. Repeat weekly(Monday).  Plan repeat echo discussed. Doppler q shift and daily NST.    3- Cervical insufficiency -Stable CL, 1.6cm closed portion  though still palpates soft(Dr. F). Pt doing vaginal prometrium and is s/p 24 hrs Mag Sulfate and 48 hrs Indocin and BMZ. If pt starts feeling increased irritability or contractions, or if worsening cervical length, will consider procardia. Recommend continued bed rest at this  time. Will need rpt MgSO4 if delivery imminent. Rescue dose BMZ discussed.    Bethany Mendoza R 07/19/2015 5:04 PM

## 2015-07-20 ENCOUNTER — Inpatient Hospital Stay (HOSPITAL_COMMUNITY): Payer: BLUE CROSS/BLUE SHIELD

## 2015-07-20 NOTE — Progress Notes (Signed)
Hospital day # 29 Cervical insufficiency Donor egg IVF di-di twin pregnancy Fetal Dysrhythmia baby A- benign PACs 27 0/[redacted] wks gestation  Subjective: Patient notes good fetal movement. Feels well today. Pt notes no contractions or cramping. No leakage of fluid, no vaginal bleeding. Patient is tolerating vaginal Prometrium.  Objective:  Vital Signs   Report       04/16 0700 04/17 0659 04/17 0700 04/17 1221   Most Recent       Temp (F)    97.7-98.1 97.5  97.5 (36.4)  04/17 1024   Pulse Rate    92-104 113   113  04/17 1025   Resp    16-18 16  16   04/17 1025   BP    91/49-111/68 109/63  109/63  04/17 1025   SpO2 (%)    94-97   97  04/16 2208       Gen.: Well-appearing, NAD Chest: nl respirations noted CV: RRR Abdomen: Gravid, nontender, no right upper quadrant pain, no uterine tightening GU: deferred: last exam 3/27:(Fogleman) cvx short and soft, palpates about 1cm in length, fetal vtx felt through thin lower segment, mid position, cvx closed Lower extremity: Nontender, no edema Neuro: nonfocal Skin : intact  NST yesterday: A: audible irreg rhythm. Spending About 1/3 time time with baseline irregular in 90's, 1/3 time at 110's at 1/3 time in 50's over 30 min NST. Good variability B: 145s, good variability, no decels  NST today:  A and B in 140's with good variability.  No deceleration so far.  Monitoring on x around 10 min.  Tocometry:slight irritability   Group B strep negative- will need rpt after 5 weeks  Assessment and plan: 27w 0d Days IUP . 1- DIDI twin gestation.  -Concordant growth 4/10. NICU tour today if possible  2-Fetal Dysrhythmia twin A.. - Likely benign PACs, may present risk of hydrops if FH staying in 50-80's for prolonged periods (weeks). No hydrops and FH still with abnl rhythm on u/s 3/27. Repeat weekly(Monday) today. Plan repeat echo discussed. Doppler q shift and daily NST.   3- Cervical insufficiency -Stable CL, 1.6cm closed  portion though still palpates soft(Dr. F). Pt doing vaginal prometrium and is s/p 24 hrs Mag Sulfate and 48 hrs Indocin and BMZ. If pt starts feeling increased irritability or contractions, or if worsening cervical length, will consider procardia. Recommend continued bed rest at this time. Will need rpt MgSO4 if delivery imminent. Rescue dose BMZ discussed.  Genia DelMarie-Lyne Attilio Zeitler MD 07/20/2015 at 12:50

## 2015-07-21 NOTE — Progress Notes (Addendum)
Laying in bed with no complaints or concerns

## 2015-07-21 NOTE — Progress Notes (Signed)
Hospital day #30 Cervical insufficiency Donor egg IVF di-di twin pregnancy Fetal Dysrhythmia baby A- benign PACs 27 1/[redacted] wks gestation  Subjective: Decreased FMs this AM but when I went back to give sono report, she reported to good FMs and will wait for NST until later.  Feels well today. Pt notes no contractions or cramping. No leakage of fluid, no vaginal bleeding. Patient is tolerating vaginal Prometrium. Using heat pad for her back.   Objective BP 106/55 mmHg  Pulse 101  Temp(Src) 98.2 F (36.8 C) (Oral)  Resp 16  Ht 5\' 1"  (1.549 m)  Wt 136 lb 11.2 oz (62.007 kg)  BMI 25.84 kg/m2  SpO2 98%  LMP 12/25/2014 (Exact Date)  Gen.: Well-appearing, NAD Chest: nl respirations noted CV: RRR Abdomen: Gravid, nontender abdomen and relaxed soft uterus  GU: deferred  Lower extremity: Nontender, no edema Neuro: nonfocal Skin : intact  NST yesterday:  A: normal rhythm, no bradycardia/ arrhythmia/ baseline change noted noted. - reactive NST. Good variability  B: 145s, good variability, no decels - reactive NST Monitoring on x around 10 min.  Tocometry: no UCs but irritability noted, no pain /UCs reported by patient  Group B strep negative- will need rpt at 28 weeks  Assessment and plan: 27w 1d Days . 1- DIDI twin gestation, IVF pregnancy.  -Concordant growth 4/10, continue q 4 weeks. NICU consult done   2-Fetal Dysrhythmia twin A.. - Likely benign PACs, at risk of hydrops but no hydrops and FH still with abnl rhythm on u/s 3/27.  Repeat weekly, normal FHT per sono report 4/17 (every Monday) Plan repeat echo discussed. Doppler q shift and daily NST.   3- Cervical insufficiency -Stable CL but ranging shorter, 1.1- 1.3 cm by sono 4/17, repeat wkly.  -Continue vaginal prometrium, add procardia if needed for UCs, none now.   4- Prematurity  S/p 24 hrs Mag Sulfate, 48 hrs Indocin and BMZ at admission Will need rpt MgSO4 if delivery imminent. Rescue dose BMZ  discussed.  Shea EvansVaishali Koi Yarbro, MD

## 2015-07-22 MED ORDER — CYCLOBENZAPRINE HCL 10 MG PO TABS
5.0000 mg | ORAL_TABLET | Freq: Once | ORAL | Status: AC
  Administered 2015-07-22: 5 mg via ORAL
  Filled 2015-07-22: qty 1

## 2015-07-22 MED ORDER — ACETAMINOPHEN 500 MG PO TABS
1000.0000 mg | ORAL_TABLET | Freq: Once | ORAL | Status: AC
  Administered 2015-07-22: 1000 mg via ORAL
  Filled 2015-07-22: qty 2

## 2015-07-22 NOTE — Progress Notes (Signed)
Hospital day #30 Cervical insufficiency Donor egg IVF di-di twin pregnancy Fetal Dysrhythmia baby A- benign PACs 27 2/[redacted] wks gestation  Subjective:  Feels well today. Pt notes no contractions or cramping. No leakage of fluid, no vaginal bleeding. Patient is tolerating vaginal Prometrium. Using heat pad for her back.   Objective BP 102/56 mmHg  Pulse 106  Temp(Src) 98.1 F (36.7 C) (Oral)  Resp 18  Ht 5\' 1"  (1.549 m)  Wt 62.007 kg (136 lb 11.2 oz)  BMI 25.84 kg/m2  SpO2 98%  LMP 12/25/2014 (Exact Date)  Gen.: Well-appearing, NAD Chest: nl respirations noted CV: RRR Abdomen: Gravid, nontender abdomen and relaxed soft uterus  GU: deferred  Lower extremity: Nontender, no edema Neuro: nonfocal Skin : intact  NST yesterday:  A: normal rhythm, no bradycardia/ arrhythmia/ baseline change noted noted. - reactive NST. Good variability  B: 145s, good variability, no decels - reactive NST Monitoring on x around 10 min.  Tocometry: no UCs but irritability noted, no pain /UCs reported by patient  Group B strep negative- will need rpt at 28 weeks  Assessment and plan: 27w 2d Days . 1- DIDI twin gestation, IVF pregnancy.  -Concordant growth 4/10, continue q 4 weeks. NICU consult done   2-Fetal Dysrhythmia twin A.. - Likely benign PACs, at risk of hydrops but no hydrops and FH still with abnl rhythm on u/s 3/27.  Repeat weekly, normal FHT per sono report 4/17 (every Monday) Plan repeat echo discussed. Doppler q shift and daily NST.   3- Cervical insufficiency -Stable CL but ranging shorter, 1.1- 1.3 cm by sono 4/17, repeat wkly.  -Continue vaginal prometrium, add procardia if needed for UCs, none now.   4- Prematurity  S/p 24 hrs Mag Sulfate, 48 hrs Indocin and BMZ at admission Will need rpt MgSO4 if delivery imminent. Rescue dose BMZ discussed.  Shea EvansVaishali Mody, MD

## 2015-07-23 MED ORDER — ACETAMINOPHEN 500 MG PO TABS: 1000.0000 mg | ORAL_TABLET | Freq: Four times a day (QID) | ORAL | Status: DC | PRN

## 2015-07-23 MED ORDER — CYCLOBENZAPRINE HCL 10 MG PO TABS: 5.0000 mg | ORAL_TABLET | Freq: Two times a day (BID) | ORAL | Status: DC | PRN

## 2015-07-23 NOTE — Progress Notes (Signed)
Hospital day #32 Cervical insufficiency Donor egg IVF di-di twin pregnancy Fetal Dysrhythmia baby A- benign PACs, now resolved 27 3/[redacted] wks gestation  Subjective: Good FM x 2, able to distinguish each baby. No ctx but does not some cramping. Back pain persists despite heating pad. Did have improvement in back pain yesterday with tylenol and flexeril though pt notes feeling funny on flexeril and only wants to use sparingly.   No leakage of fluid, no vaginal bleeding.   Patient is tolerating vaginal Prometrium.   Objective BP 104/67 mmHg  Pulse 102  Temp(Src) 98.4 F (36.9 C) (Oral)  Resp 16  Ht 5\' 1"  (1.549 m)  Wt 63.05 kg (139 lb)  BMI 26.28 kg/m2  SpO2 98%  LMP 12/25/2014 (Exact Date)  Gen.: Well-appearing, NAD Abdomen: Gravid, nontender abdomen and relaxed soft uterus  GU: deferred  Lower extremity: Nontender, no edema Neuro: nonfocal Skin : intact  NST today:  A:145's,  normal rhythm, no bradycardia/ arrhythmia/ baseline change noted noted. - reactive NST. Good variability, no decels, + accels,  B: 145s, good variability, + accels no decels - reactive NST  Tocometry: no UCs but irritability noted.  Group B strep negative- will need rpt at 28 weeks  Assessment and plan: 27w 3d . 1- DIDI twin gestation, IVF pregnancy.  -Concordant growth 4/10, continue q 3-4 weeks. NICU consult done   2-Fetal Dysrhythmia twin A.. - Likely benign PACs, at risk of hydrops with persistent/ sustained bradycardia/ arrythmia but normal rhythm and NST over the past  Week. No indication at this time for repeat echo. Continue to evaluate. NST daily.   3- Cervical insufficiency -Stable CL but ranging slightly shorter, 1.1- 1.3 cm by sono 4/17, repeat wkly.  -Continue vaginal prometrium, add procardia if needed for symptomatic UCs or rapid change to cervical length, defer at this time.   4- Prematurity  S/p 24 hrs Mag Sulfate, 48 hrs Indocin and BMZ at admission Will need rpt MgSO4 if  delivery imminent. Rescue dose BMZ possibly prior to d/c or if delivery imminent.  5- routine OB- plan repeat GBS, DS and Tdap at 28 wks prior to d/c  6- Dispo, Consider d/c at 28 wks if CL stable. Pt able to comply with bedrest at home.   Krzysztof Reichelt A. 07/23/2015 8:24 PM late entry of note, seen at 1p.

## 2015-07-24 NOTE — Progress Notes (Signed)
Hospital day #33 Cervical insufficiency Donor egg IVF di-di twin pregnancy Fetal Dysrhythmia baby A- benign PACs, now resolved 27 4/[redacted] wks gestation  Subjective: Good FM x 2./ No ctx but does not some cramping. Comfort care for back pain No leakage of fluid, no vaginal bleeding.   Objective BP 115/67 mmHg  Pulse 109  Temp(Src) 98.1 F (36.7 C) (Oral)  Resp 16  Ht 5\' 1"  (1.549 m)  Wt 139 lb (63.05 kg)  BMI 26.28 kg/m2  SpO2 98%  LMP 12/25/2014 (Exact Date)  Gen.: Well-appearing, NAD Abdomen: Gravid, nontender abdomen and relaxed soft uterus  GU: deferred  Lower extremity: Nontender, no edema Neuro: nonfocal Skin : intact  NST today:  A:145's,  normal rhythm, no bradycardia/ arrhythmia/ baseline change noted noted. - reactive NST. Good variability, no decels, + accels,  B: 145s, good variability, + accels no decels - reactive NST  Tocometry: no UCs but irritability noted.  Group B strep negative- will need rpt at 28 weeks  Assessment and plan: 27w 4d . 1- DIDI twin gestation, IVF pregnancy.  -Concordant growth 4/10, continue q 3-4 weeks. NICU consult done   2-Fetal Dysrhythmia twin A.. - Likely benign PACs, at risk of hydrops with persistent/ sustained bradycardia/ arrythmia but normal rhythm and NST over the past  Week. No indication at this time for repeat echo. Continue to evaluate. NST daily.   3- Cervical insufficiency -Stable CL but ranging slightly shorter, 1.1- 1.3 cm by sono 4/17, repeat wkly.  -Continue vaginal prometrium, add procardia if needed for symptomatic UCs or rapid change to cervical length, defer at this time.   4- Prematurity  S/p 24 hrs Mag Sulfate, 48 hrs Indocin and BMZ at admission Will need rpt MgSO4 if delivery imminent. Rescue dose BMZ possibly prior to d/c or if delivery imminent.  5- routine OB- plan repeat GBS, DS and Tdap at 28 wks prior to d/c  6- Dispo, Consider d/c at 28 wks if CL stable. Pt able to comply with bedrest at home.    Laquinta Hazell R 07/24/2015 11:53 AM

## 2015-07-25 NOTE — Progress Notes (Signed)
Bethany DelMarie-Lyne Wilmoth Rasnic MD 07/25/2015 at 1:04 PM  Hospital day #34 Cervical insufficiency Donor egg IVF di-di twin pregnancy Fetal Dysrhythmia baby A- benign PACs, now resolved 27 5/[redacted] wks gestation  Subjective: Good FM x 2./ No ctx but does note some cramping. Comfort care for back pain No leakage of fluid, no vaginal bleeding.   Objective  Vital Signs   Report       04/21 0700 04/22 0659 04/22 0700 04/22 1303   Most Recent       Temp (F)    98-98.1 98.2-98.4  98.2 (36.8)  04/22 1230   Pulse Rate    94-109 96-103   103  04/22 1230   Resp    16-18 15  15   04/22 1230   BP    101/57-115/67 98/60-109/60  109/60  04/22 1230       Gen.: Well-appearing, NAD Abdomen: Gravid, nontender abdomen and relaxed soft uterus  GU: deferred  Lower extremity: Nontender, no edema Neuro: nonfocal Skin : intact  NST today: A:130-140's, normal rhythm, no bradycardia/ arrhythmia/ baseline change noted noted. - reactive NST. Good variability, no decels, + accels,  B: 130-140s, good variability, + accels no decels - reactive NST Tocometry: no UCs but irritability noted.  Group B strep negative- will need rpt at 28 weeks  Assessment and plan: 27w 5d . 1- DIDI twin gestation, IVF pregnancy.  -Concordant growth 4/10, continue q 3-4 weeks. NICU consult done   2-Fetal Dysrhythmia twin A.. - Likely benign PACs, at risk of hydrops with persistent/ sustained bradycardia/ arrythmia but normal rhythm and NST over the past Week. No indication at this time for repeat echo. Continue to evaluate. NST daily.   3- Cervical insufficiency -Stable CL but ranging slightly shorter, 1.1- 1.3 cm by sono 4/17, repeat wkly.  -Continue vaginal prometrium, add procardia if needed for symptomatic UCs or rapid change to cervical length, defer at this time.   4- Prematurity  S/p 24 hrs Mag Sulfate, 48 hrs Indocin and BMZ at admission Will need rpt MgSO4 if delivery imminent. Rescue dose BMZ possibly  prior to d/c or if delivery imminent.  5- routine OB- plan repeat GBS, DS and Tdap at 28 wks prior to d/c  6- Dispo, Consider d/c at 28 wks if CL stable. Pt able to comply with bedrest at home.   Bethany DelMarie-Lyne Marquesa Rath MD  07/25/2015 at 1:07 pm

## 2015-07-26 MED ORDER — TETANUS-DIPHTH-ACELL PERTUSSIS 5-2.5-18.5 LF-MCG/0.5 IM SUSP: 0.5000 mL | INTRAMUSCULAR | Status: DC | PRN

## 2015-07-26 NOTE — Progress Notes (Signed)
Bethany DelMarie-Lyne Bryn Perkin MD 07/26/2015 at 12:16 PM  Hospital day #35 Cervical insufficiency Donor egg IVF di-di twin pregnancy Fetal Dysrhythmia baby A- benign PACs, now resolved 27 6/[redacted] wks gestation  Subjective: Good FM x 2./ No ctx but does note some cramping. Comfort care for back pain No leakage of fluid, no vaginal bleeding.   Objective  Vital Signs   Report       04/22 0700 04/23 0659 04/23 0700 04/23 1214   Most Recent       Temp (F)    97.8-98.4 98-98.4  98.4 (36.9)  04/23 1201   Pulse Rate    96-105 100-112   112  04/23 1201   Resp    15-18 16  16   04/23 1201   BP    94/54-111/62 100/54-101/59   101/59  04/23 1201       Gen.: Well-appearing, NAD Abdomen: Gravid, nontender abdomen and relaxed soft uterus  GU: deferred  Lower extremity: Nontender, no edema Neuro: nonfocal Skin : intact  NST today: A:130-140's, normal rhythm, no bradycardia/ arrhythmia/ baseline change noted noted. - reactive NST. Good variability, no decels, + accels,  B: 130-140s, good variability, + accels no decels - reactive NST Tocometry: no UCs but irritability noted.  Group B strep negative- will need rpt at 28 weeks  Assessment and plan: 27w 6d . 1- DIDI twin gestation, IVF pregnancy.  -Concordant growth 4/10, continue q 3-4 weeks. NICU consult done   2-Fetal Dysrhythmia twin A.. - Likely benign PACs, at risk of hydrops with persistent/ sustained bradycardia/Dysrythmia but normal rhythm and NST over the past Week. No indication at this time for repeat echo. Continue to evaluate. NST daily.   3- Cervical insufficiency -Stable CL but ranging slightly shorter, 1.1- 1.3 cm by sono 4/17, repeat wkly.  -Continue vaginal prometrium, add procardia if needed for symptomatic UCs or rapid change to cervical length, defer at this time.   4- Prematurity  S/p 24 hrs Mag Sulfate, 48 hrs Indocin and BMZ at admission Will need rpt MgSO4 if delivery imminent. Rescue dose BMZ  possibly prior to d/c or if delivery imminent.  5- routine OB- plan repeat GBS, DS and Tdap at 28 wks prior to d/c  6- Dispo, Consider d/c at 28 wks if CL stable. Pt able to comply with bedrest at home.   Bethany DelMarie-Lyne Yukie Bergeron MD 07/26/2015 at 12:18 pm

## 2015-07-27 ENCOUNTER — Inpatient Hospital Stay (HOSPITAL_COMMUNITY): Payer: BLUE CROSS/BLUE SHIELD

## 2015-07-27 LAB — GLUCOSE TOLERANCE, 1 HOUR: GLUCOSE 1 HOUR GTT: 183 mg/dL — AB (ref 70–140)

## 2015-07-27 MED ORDER — PROGESTERONE MICRONIZED 200 MG PO CAPS: 200.0000 mg | ORAL_CAPSULE | Freq: Every day | ORAL | Status: DC

## 2015-07-27 MED ORDER — CYCLOBENZAPRINE HCL 5 MG PO TABS: 5.0000 mg | ORAL_TABLET | Freq: Every day | ORAL | Status: AC

## 2015-07-27 MED ORDER — TETANUS-DIPHTH-ACELL PERTUSSIS 5-2.5-18.5 LF-MCG/0.5 IM SUSP
0.5000 mL | Freq: Once | INTRAMUSCULAR | Status: AC
  Administered 2015-07-27: 0.5 mL via INTRAMUSCULAR
  Filled 2015-07-27: qty 0.5

## 2015-07-27 MED ORDER — POLYETHYLENE GLYCOL 3350 17 G PO PACK: 17.0000 g | PACK | Freq: Every day | ORAL | Status: AC | PRN

## 2015-07-27 MED ORDER — DOCUSATE SODIUM 100 MG PO CAPS: 100.0000 mg | ORAL_CAPSULE | Freq: Two times a day (BID) | ORAL | Status: AC

## 2015-07-27 MED ORDER — PANTOPRAZOLE SODIUM 40 MG PO TBEC: 40.0000 mg | DELAYED_RELEASE_TABLET | Freq: Every day | ORAL | Status: AC

## 2015-07-27 NOTE — Discharge Summary (Signed)
Patient ID: Bethany Mendoza MRN: [id number] DOB/AGE: 05/25/1975 39 y.o.  Admit date: 06/22/2015 Discharge date: 07/27/15  Admission Diagnoses: 23WKS, SHORT CERVIX, cervical incompetence, arrhythmia baby A  Discharge Diagnoses: 23WKS, SHORT CERVIX, cervical incompetence, arrhythmia baby          Discharged Condition: stable  Hospital Course: Pt admitted with progressive cervical shortening in the setting of prior precipitous delivery, recurrent pregnancy loss (nl thrombophilia testing) and donor egg IVF twins. She was placed on bedrest, 48 hrs indocin, BMZ x 2 doses, 24 hrs Mag Sulfate, vaginal prometrium and fetal monitoring. Initial u/s showed bradycardia of fetus A. MFM and pediatric cardiology consulted on this with the diagnosis of frequent non-conducted PACs for which baby eventually resolved to normal rhythm. Pt intially was on strict bedrest but with increasing gestational age she was allowed bathroom then shower then wheelchair privileges. She mostly stayed on bedrest with reactive fetal testing and stable cervical length. She had weekly u/s to asses fetal heart rhythms, r/o hydrops and eval cervical length.  On day of d/c at 28'0 pt notes no contractions, stable irritability, no LOF, no abdominal pain, no VB. She had u/s with CL 1.1 to 1.2 cm, vtx/ vtx reactive fetuses. She had a 1 hr GTT at 183 and repeat GBS culture sent. She received a Tdap. On NST A: 135's, + accels, no decels, 10 beat variability. B: 135's, + accels, no decels, 10 beat variability. Toco: irritability. Abd: soft, NT. LE NT, no edema, SSE: 1cm length,. Soft, but closed.   Consults: pediatric cariology and MFM  Treatments: IV hydration, steroids: BMZ and bedrest, tocolysis with indocin and Mag Sulfate  Disposition: home     Medication List    STOP taking these medications        DICLEGIS PO     ranitidine 150 MG capsule  Commonly known as:  ZANTAC      TAKE these medications        aspirin 81 MG tablet   Take 81 mg by mouth daily.     cyclobenzaprine 5 MG tablet  Commonly known as:  FLEXERIL  Take 1 tablet (5 mg total) by mouth at bedtime.     docusate sodium 100 MG capsule  Commonly known as:  COLACE  Take 1 capsule (100 mg total) by mouth 2 (two) times daily.     pantoprazole 40 MG tablet  Commonly known as:  PROTONIX  Take 1 tablet (40 mg total) by mouth daily.     polyethylene glycol packet  Commonly known as:  MIRALAX / GLYCOLAX  Take 17 g by mouth daily as needed for moderate constipation.     prenatal multivitamin Tabs tablet  Take 1 tablet by mouth daily at 12 noon.     progesterone 200 MG capsule  Commonly known as:  PROMETRIUM  Place 1 capsule (200 mg total) vaginally at bedtime.     Vitamin D-3 1000 units Caps  Take by mouth daily.         Signed: Lendon ColonelFOGLEMAN,Baker Kogler A., MD MD 07/27/2015, 6:52 PM

## 2015-07-27 NOTE — Progress Notes (Signed)
Pt transported off unit via wheelchair with no further concerns.

## 2015-07-29 LAB — CULTURE, BETA STREP (GROUP B ONLY)

## 2015-08-03 LAB — OB RESULTS CONSOLE RPR: RPR: NONREACTIVE

## 2015-09-15 LAB — OB RESULTS CONSOLE GBS: STREP GROUP B AG: NEGATIVE

## 2015-09-28 ENCOUNTER — Other Ambulatory Visit: Payer: Self-pay | Admitting: Obstetrics and Gynecology

## 2015-09-28 ENCOUNTER — Inpatient Hospital Stay (HOSPITAL_COMMUNITY): Payer: BLUE CROSS/BLUE SHIELD | Admitting: Anesthesiology

## 2015-09-28 ENCOUNTER — Inpatient Hospital Stay (HOSPITAL_COMMUNITY)
Admission: AD | Admit: 2015-09-28 | Discharge: 2015-09-30 | DRG: 775 | Disposition: A | Discharge status: Home / Self Care | Payer: BLUE CROSS/BLUE SHIELD | Source: Ambulatory Visit | Attending: Obstetrics and Gynecology | Admitting: Obstetrics and Gynecology

## 2015-09-28 ENCOUNTER — Encounter (HOSPITAL_COMMUNITY): Payer: Self-pay | Admitting: *Deleted

## 2015-09-28 DIAGNOSIS — Z3A37 37 weeks gestation of pregnancy: Secondary | ICD-10-CM

## 2015-09-28 DIAGNOSIS — O134 Gestational [pregnancy-induced] hypertension without significant proteinuria, complicating childbirth: Secondary | ICD-10-CM | POA: Diagnosis present

## 2015-09-28 DIAGNOSIS — O30043 Twin pregnancy, dichorionic/diamniotic, third trimester: Secondary | ICD-10-CM | POA: Diagnosis present

## 2015-09-28 DIAGNOSIS — O09219 Supervision of pregnancy with history of pre-term labor, unspecified trimester: Secondary | ICD-10-CM

## 2015-09-28 LAB — TYPE AND SCREEN
ABO/RH(D): O POS
Antibody Screen: NEGATIVE

## 2015-09-28 LAB — CBC
HEMATOCRIT: 36.7 % (ref 36.0–46.0)
HEMOGLOBIN: 12 g/dL (ref 12.0–15.0)
MCH: 27.2 pg (ref 26.0–34.0)
MCHC: 32.7 g/dL (ref 30.0–36.0)
MCV: 83.2 fL (ref 78.0–100.0)
Platelets: 168 10*3/uL (ref 150–400)
RBC: 4.41 MIL/uL (ref 3.87–5.11)
RDW: 20.1 % — ABNORMAL HIGH (ref 11.5–15.5)
WBC: 7.2 10*3/uL (ref 4.0–10.5)

## 2015-09-28 MED ORDER — DIBUCAINE 1 % RE OINT
1.0000 "application " | TOPICAL_OINTMENT | RECTAL | Status: DC | PRN
  Filled 2015-09-28: qty 28

## 2015-09-28 MED ORDER — DIPHENHYDRAMINE HCL 25 MG PO CAPS: 25.0000 mg | ORAL_CAPSULE | Freq: Four times a day (QID) | ORAL | Status: DC | PRN

## 2015-09-28 MED ORDER — LIDOCAINE HCL (PF) 1 % IJ SOLN
30.0000 mL | INTRAMUSCULAR | Status: DC | PRN
  Filled 2015-09-28: qty 30

## 2015-09-28 MED ORDER — BUPIVACAINE HCL (PF) 0.25 % IJ SOLN
INTRAMUSCULAR | Status: DC | PRN
  Administered 2015-09-28 (×2): 4 mL via EPIDURAL

## 2015-09-28 MED ORDER — TERBUTALINE SULFATE 1 MG/ML IJ SOLN: 0.2500 mg | Freq: Once | INTRAMUSCULAR | Status: DC | PRN

## 2015-09-28 MED ORDER — OXYCODONE-ACETAMINOPHEN 5-325 MG PO TABS: 2.0000 | ORAL_TABLET | ORAL | Status: DC | PRN

## 2015-09-28 MED ORDER — LACTATED RINGERS IV SOLN
500.0000 mL | INTRAVENOUS | Status: DC | PRN
  Administered 2015-09-28: 1000 mL via INTRAVENOUS

## 2015-09-28 MED ORDER — IBUPROFEN 600 MG PO TABS
600.0000 mg | ORAL_TABLET | Freq: Four times a day (QID) | ORAL | Status: DC
  Administered 2015-09-28 – 2015-09-30 (×7): 600 mg via ORAL
  Filled 2015-09-28 (×7): qty 1

## 2015-09-28 MED ORDER — OXYCODONE-ACETAMINOPHEN 5-325 MG PO TABS: 1.0000 | ORAL_TABLET | ORAL | Status: DC | PRN

## 2015-09-28 MED ORDER — LIDOCAINE-EPINEPHRINE (PF) 2 %-1:200000 IJ SOLN
INTRAMUSCULAR | Status: DC | PRN
  Administered 2015-09-28: 3 mL

## 2015-09-28 MED ORDER — METHYLERGONOVINE MALEATE 0.2 MG/ML IJ SOLN
INTRAMUSCULAR | Status: AC
  Administered 2015-09-28: 0.2 mg via INTRAMUSCULAR
  Filled 2015-09-28: qty 1

## 2015-09-28 MED ORDER — DIPHENHYDRAMINE HCL 50 MG/ML IJ SOLN: 12.5000 mg | INTRAMUSCULAR | Status: DC | PRN

## 2015-09-28 MED ORDER — WITCH HAZEL-GLYCERIN EX PADS: 1.0000 "application " | MEDICATED_PAD | CUTANEOUS | Status: DC | PRN

## 2015-09-28 MED ORDER — FLEET ENEMA 7-19 GM/118ML RE ENEM
1.0000 | ENEMA | RECTAL | Status: DC | PRN
Start: 2015-09-28 — End: 2015-09-28

## 2015-09-28 MED ORDER — ACETAMINOPHEN 325 MG PO TABS: 650.0000 mg | ORAL_TABLET | ORAL | Status: DC | PRN

## 2015-09-28 MED ORDER — SIMETHICONE 80 MG PO CHEW: 80.0000 mg | CHEWABLE_TABLET | ORAL | Status: DC | PRN

## 2015-09-28 MED ORDER — FENTANYL 2.5 MCG/ML BUPIVACAINE 1/10 % EPIDURAL INFUSION (WH - ANES)
14.0000 mL/h | INTRAMUSCULAR | Status: DC | PRN
Start: 2015-09-28 — End: 2015-09-28
  Administered 2015-09-28: 12 mL/h via EPIDURAL
  Filled 2015-09-28: qty 125

## 2015-09-28 MED ORDER — METHYLERGONOVINE MALEATE 0.2 MG PO TABS: 0.2000 mg | ORAL_TABLET | ORAL | Status: DC | PRN

## 2015-09-28 MED ORDER — TETANUS-DIPHTH-ACELL PERTUSSIS 5-2.5-18.5 LF-MCG/0.5 IM SUSP: 0.5000 mL | Freq: Once | INTRAMUSCULAR | Status: DC

## 2015-09-28 MED ORDER — OXYTOCIN 40 UNITS IN LACTATED RINGERS INFUSION - SIMPLE MED
1.0000 m[IU]/min | INTRAVENOUS | Status: DC
  Administered 2015-09-28: 2 m[IU]/min via INTRAVENOUS
  Filled 2015-09-28: qty 1000

## 2015-09-28 MED ORDER — SENNOSIDES-DOCUSATE SODIUM 8.6-50 MG PO TABS
2.0000 | ORAL_TABLET | ORAL | Status: DC
  Administered 2015-09-28 – 2015-09-30 (×2): 2 via ORAL
  Filled 2015-09-28 (×2): qty 2

## 2015-09-28 MED ORDER — ZOLPIDEM TARTRATE 5 MG PO TABS: 5.0000 mg | ORAL_TABLET | Freq: Every evening | ORAL | Status: DC | PRN

## 2015-09-28 MED ORDER — PHENYLEPHRINE 40 MCG/ML (10ML) SYRINGE FOR IV PUSH (FOR BLOOD PRESSURE SUPPORT)
80.0000 ug | PREFILLED_SYRINGE | INTRAVENOUS | Status: DC | PRN
  Filled 2015-09-28: qty 10

## 2015-09-28 MED ORDER — OXYTOCIN BOLUS FROM INFUSION: 500.0000 mL | INTRAVENOUS | Status: DC

## 2015-09-28 MED ORDER — PRENATAL MULTIVITAMIN CH
1.0000 | ORAL_TABLET | Freq: Every day | ORAL | Status: DC
  Administered 2015-09-29 – 2015-09-30 (×2): 1 via ORAL
  Filled 2015-09-28 (×2): qty 1

## 2015-09-28 MED ORDER — METHYLERGONOVINE MALEATE 0.2 MG/ML IJ SOLN
0.2000 mg | INTRAMUSCULAR | Status: DC | PRN
  Administered 2015-09-28: 0.2 mg via INTRAMUSCULAR

## 2015-09-28 MED ORDER — LACTATED RINGERS IV SOLN
500.0000 mL | Freq: Once | INTRAVENOUS | Status: AC
  Administered 2015-09-28: 1000 mL via INTRAVENOUS

## 2015-09-28 MED ORDER — LACTATED RINGERS IV SOLN
INTRAVENOUS | Status: DC
  Administered 2015-09-28 (×2): via INTRAVENOUS

## 2015-09-28 MED ORDER — EPHEDRINE 5 MG/ML INJ
10.0000 mg | INTRAVENOUS | Status: DC | PRN
  Filled 2015-09-28: qty 4

## 2015-09-28 MED ORDER — OXYTOCIN 40 UNITS IN LACTATED RINGERS INFUSION - SIMPLE MED
2.5000 [IU]/h | INTRAVENOUS | Status: DC
  Administered 2015-09-28: 2.5 [IU]/h via INTRAVENOUS

## 2015-09-28 MED ORDER — ONDANSETRON HCL 4 MG/2ML IJ SOLN: 4.0000 mg | Freq: Four times a day (QID) | INTRAMUSCULAR | Status: DC | PRN

## 2015-09-28 MED ORDER — SOD CITRATE-CITRIC ACID 500-334 MG/5ML PO SOLN
30.0000 mL | ORAL | Status: DC | PRN
Start: 2015-09-28 — End: 2015-09-28
  Filled 2015-09-28: qty 15

## 2015-09-28 MED ORDER — PHENYLEPHRINE 40 MCG/ML (10ML) SYRINGE FOR IV PUSH (FOR BLOOD PRESSURE SUPPORT)
80.0000 ug | PREFILLED_SYRINGE | INTRAVENOUS | Status: AC | PRN
  Administered 2015-09-28 (×3): 80 ug via INTRAVENOUS

## 2015-09-28 MED ORDER — ONDANSETRON HCL 4 MG PO TABS: 4.0000 mg | ORAL_TABLET | ORAL | Status: DC | PRN

## 2015-09-28 MED ORDER — COCONUT OIL OIL: 1.0000 "application " | TOPICAL_OIL | Status: DC | PRN

## 2015-09-28 MED ORDER — ONDANSETRON HCL 4 MG/2ML IJ SOLN: 4.0000 mg | INTRAMUSCULAR | Status: DC | PRN

## 2015-09-28 MED ORDER — BENZOCAINE-MENTHOL 20-0.5 % EX AERO
1.0000 "application " | INHALATION_SPRAY | CUTANEOUS | Status: DC | PRN
  Filled 2015-09-28: qty 56

## 2015-09-28 MED ORDER — EPHEDRINE 5 MG/ML INJ
10.0000 mg | INTRAVENOUS | Status: DC | PRN
  Administered 2015-09-28: 5 mg via INTRAVENOUS

## 2015-09-28 NOTE — Lactation Note (Signed)
This note was copied from a baby's chart. Lactation Consultation Note Initial visit at 4 hours of age with Twins A and B.  Mom reports having difficulty with older child and had to use a NS for 9 months.  Babies have not had a feeding yet, but mom is holding baby B "Bethany Mendoza"  Baby laid STS with mom in laid back hold.  Baby noted to have circumoral cyanosis with pink lips.  Peds at bedside and aware, encouraged baby to try latch.  Baby will mouth nipple, but no sucking at this time.  Attempted hand expression with only a tiny drop noted.  Baby A "Bethany Mendoza" Placed to breast in laid back hold on right breast.  Right nipple inverts with compression.  Hand pumped used without much improvement.  Teacup hold used to latch and baby sucks a few times on and off.  Mom reports feeling cramps during attempt.  Encouraged allowing baby time STS with mom.  Plan mom to feed with early cues on demand and offer baby STS in between feedings as desired.  Encouraged mom to work on hand expression alternating breasts while sitting up.  Discussed using a NS if needed and LC encouraged mom to give baby a little more time to show effort at breast before using NS.  Mom is aware of need to use DEBP if needing NS.  Mom has a DEBP ordered from insurance and wil need a 2 week rental if pumping at discharge.  Fob at bedside supportive. [facility] LC resources given and discussed.  Early newborn behavior discussed.  Mom to call for assist as needed.     Patient Name: Bethany Mendoza ZOXWR'UToday's Date: 09/28/2015 Reason for consult: Initial assessment;Multiple gestation   Maternal Data Has patient been taught Hand Expression?: Yes Does the patient have breastfeeding experience prior to this delivery?: Yes  Feeding Feeding Type: Breast Fed Length of feed:  (few sucks)  LATCH Score/Interventions Latch: Repeated attempts needed to sustain latch, nipple held in mouth throughout feeding, stimulation needed to elicit sucking  reflex. Intervention(s): Adjust position;Assist with latch;Breast massage;Breast compression  Audible Swallowing: None  Type of Nipple: Flat (right nipple inverts with compression) Intervention(s): Hand pump  Comfort (Breast/Nipple): Soft / non-tender     Hold (Positioning): Assistance needed to correctly position infant at breast and maintain latch. Intervention(s): Breastfeeding basics reviewed;Support Pillows;Position options;Skin to skin  LATCH Score: 5  Lactation Tools Discussed/Used WIC Program: No Pump Review: Setup, frequency, and cleaning;Milk Storage Initiated by:: Bethany Mendoza Date initiated:: 09/28/15   Consult Status Consult Status: Follow-up Date: 09/29/15 Follow-up type: In-patient    Bethany Mendoza, Bethany Mendoza Lynn 09/28/2015, 10:04 PM

## 2015-09-28 NOTE — H&P (Signed)
Bethany Mendoza is a 40 y.o. female presenting for IOL due to advanced dilatation and history of precipitous labor. 37 week DIDI twin gestation. Discussed with MFM  Maternal Medical History:  Reason for admission: Contractions.   Contractions: Onset was less than 1 hour ago.   Frequency: irregular and rare.   Perceived severity is mild.    Fetal activity: Perceived fetal activity is normal.   Last perceived fetal movement was within the past hour.    Prenatal complications: Preterm labor.   Prenatal Complications - Diabetes: none.    OB History    Gravida Para Term Preterm AB TAB SAB Ectopic Multiple Living   5 1 1  3  3   1       Obstetric Comments   1st Menstrual Cycle:  15 1st Pregnancy:  27      Past Medical History  Diagnosis Date  . Pregnancy induced hypertension   . GERD (gastroesophageal reflux disease)    Past Surgical History  Procedure Laterality Date  . Fracture surgery Left     age 40  . Dilation and curettage of uterus  2007  . Dilation and curettage of uterus  2005   Family History: family history includes Colon polyps in her mother; Colonic polyp in her maternal uncle. Social History:  reports that she has never smoked. She does not have any smokeless tobacco history on file. She reports that she does not drink alcohol or use illicit drugs.   Prenatal Transfer Tool  Maternal Diabetes: No Genetic Screening: Normal Maternal Ultrasounds/Referrals: Normal Fetal Ultrasounds or other Referrals:  None Maternal Substance Abuse:  No Significant Maternal Medications:  None Significant Maternal Lab Results:  None Other Comments:  None  Review of Systems  Constitutional: Negative.   All other systems reviewed and are negative.     Last menstrual period 12/25/2014. Maternal Exam:  Uterine Assessment: Contraction strength is mild.  Contraction frequency is rare.   Abdomen: Patient reports no abdominal tenderness. Fetal presentation: vertex  Introitus:  Normal vulva. Normal vagina.  Ferning test: not done.  Nitrazine test: not done. Amniotic fluid character: not assessed.  Pelvis: adequate for delivery.   Cervix: Cervix evaluated by digital exam.     Physical Exam  Constitutional: She is oriented to person, place, and time. She appears well-developed and well-nourished.  HENT:  Head: Normocephalic.  Neck: Normal range of motion. Neck supple.  Cardiovascular: Normal rate and regular rhythm.   Respiratory: Breath sounds normal.  GI: Soft. Bowel sounds are normal.  Genitourinary: Vagina normal and uterus normal.  Musculoskeletal: Normal range of motion.  Neurological: She is alert and oriented to person, place, and time. She has normal reflexes.  Skin: Skin is warm and dry.  Psychiatric: She has a normal mood and affect.    Prenatal labs: ABO, Rh: --/--/O POS (04/01 1324) Antibody: NEG (04/01 1324) Rubella:   RPR:    HBsAg:    HIV:    GBS:     Assessment/Plan: 37 week DIDI twins  Advanced dilatation History of precipitous labor IOL Approved by MFM(Decker)   Lenoard AdenAAVON,Zaion Hreha J 09/28/2015, 8:37 AM

## 2015-09-28 NOTE — Anesthesia Procedure Notes (Signed)
Epidural Patient location during procedure: OB  Staffing Anesthesiologist: Devaris Quirk Performed by: anesthesiologist   Preanesthetic Checklist Completed: patient identified, surgical consent, pre-op evaluation, timeout performed, IV checked, risks and benefits discussed and monitors and equipment checked  Epidural Patient position: sitting Prep: DuraPrep Patient monitoring: heart rate, cardiac monitor, continuous pulse ox and blood pressure Approach: midline Location: L3-L4 Injection technique: LOR saline  Needle:  Needle type: Tuohy  Needle gauge: 17 G Needle length: 9 cm Catheter type: closed end flexible Catheter size: 19 Gauge Test dose: negative and 2% lidocaine with Epi 1:200 K  Assessment Events: blood not aspirated, injection not painful, no injection resistance, negative IV test and no paresthesia  Additional Notes Reason for block:procedure for pain

## 2015-09-28 NOTE — Anesthesia Preprocedure Evaluation (Signed)
Anesthesia Evaluation  Patient identified by MRN, date of birth, ID band Patient awake    Reviewed: Allergy & Precautions, NPO status , Patient's Chart, lab work & pertinent test results  History of Anesthesia Complications Negative for: history of anesthetic complications  Airway Mallampati: II  TM Distance: >3 FB Neck ROM: Full    Dental  (+) Teeth Intact   Pulmonary neg pulmonary ROS,    breath sounds clear to auscultation       Cardiovascular negative cardio ROS   Rhythm:Regular     Neuro/Psych negative neurological ROS  negative psych ROS   GI/Hepatic Neg liver ROS, GERD  Medicated and Controlled,  Endo/Other  negative endocrine ROS  Renal/GU negative Renal ROS     Musculoskeletal negative musculoskeletal ROS (+)   Abdominal   Peds  Hematology negative hematology ROS (+)   Anesthesia Other Findings   Reproductive/Obstetrics (+) Pregnancy                             Anesthesia Physical Anesthesia Plan  ASA: II  Anesthesia Plan: Epidural   Post-op Pain Management:    Induction:   Airway Management Planned:   Additional Equipment:   Intra-op Plan:   Post-operative Plan:   Informed Consent: I have reviewed the patients History and Physical, chart, labs and discussed the procedure including the risks, benefits and alternatives for the proposed anesthesia with the patient or authorized representative who has indicated his/her understanding and acceptance.   Dental advisory given  Plan Discussed with: Anesthesiologist  Anesthesia Plan Comments:         Anesthesia Quick Evaluation

## 2015-09-28 NOTE — Progress Notes (Signed)
Bethany Mendoza is a 40 y.o. G5P1031 at [redacted]w[redacted]d by LMP admitted for induction of labor due to Prior precipitous labor and advanced dilatation with DIDI twins.  Subjective: Occ pressure  Objective: BP 119/78 mmHg  Pulse 95  Temp(Src) 98.1 F (36.7 C) (Oral)  Resp 18  Ht 5\' 1"  (1.549 m)  Wt 71.668 kg (158 lb)  BMI 29.87 kg/m2  SpO2 98%  LMP 12/25/2014 (Exact Date)      FHT:  FHR: 145 bpm, variability: moderate,  accelerations:  Present,  decelerations:  Absent and   UC:   regular, every 2-4 minutes SVE:   Dilation: 8.5 Effacement (%): 100 Station: 0 Exam by:: Dr. Billy Coastaavon  Labs: Lab Results  Component Value Date   WBC 7.2 09/28/2015   HGB 12.0 09/28/2015   HCT 36.7 09/28/2015   MCV 83.2 09/28/2015   PLT 168 09/28/2015    Assessment / Plan: Induction of labor due to multifetal gestation,  progressing well on pitocin  Labor: Progressing normally Preeclampsia:  no signs or symptoms of toxicity Fetal Wellbeing:  Category I Pain Control:  Epidural I/D:  n/a Anticipated MOD:  NSVD  Bethany Mendoza J 09/28/2015, 4:30 PM

## 2015-09-28 NOTE — Progress Notes (Signed)
Baby A and Baby B U/S switched from 1452-1454. U/S adjusted and assessing.

## 2015-09-29 LAB — CBC
HCT: 29.2 % — ABNORMAL LOW (ref 36.0–46.0)
Hemoglobin: 9.7 g/dL — ABNORMAL LOW (ref 12.0–15.0)
MCH: 27.4 pg (ref 26.0–34.0)
MCHC: 33.2 g/dL (ref 30.0–36.0)
MCV: 82.5 fL (ref 78.0–100.0)
Platelets: 146 10*3/uL — ABNORMAL LOW (ref 150–400)
RBC: 3.54 MIL/uL — ABNORMAL LOW (ref 3.87–5.11)
RDW: 20 % — AB (ref 11.5–15.5)
WBC: 9 10*3/uL (ref 4.0–10.5)

## 2015-09-29 LAB — RPR: RPR: NONREACTIVE

## 2015-09-29 MED ORDER — OXYCODONE-ACETAMINOPHEN 5-325 MG PO TABS
1.0000 | ORAL_TABLET | ORAL | Status: DC | PRN
  Administered 2015-09-29: 1 via ORAL
  Administered 2015-09-29: 2 via ORAL
  Administered 2015-09-29: 1 via ORAL
  Administered 2015-09-29 – 2015-09-30 (×4): 2 via ORAL
  Filled 2015-09-29 (×2): qty 1
  Filled 2015-09-29 (×6): qty 2

## 2015-09-29 MED ORDER — MAGNESIUM OXIDE 400 (241.3 MG) MG PO TABS
400.0000 mg | ORAL_TABLET | Freq: Every day | ORAL | Status: DC
  Administered 2015-09-29 – 2015-09-30 (×2): 400 mg via ORAL
  Filled 2015-09-29 (×3): qty 1

## 2015-09-29 MED ORDER — DOCUSATE SODIUM 100 MG PO CAPS
200.0000 mg | ORAL_CAPSULE | Freq: Two times a day (BID) | ORAL | Status: DC
  Administered 2015-09-29 – 2015-09-30 (×3): 200 mg via ORAL
  Filled 2015-09-29 (×4): qty 2

## 2015-09-29 NOTE — Anesthesia Postprocedure Evaluation (Signed)
Anesthesia Post Note  Patient: Bethany Mendoza  Procedure(s) Performed: * No procedures listed *  Patient location during evaluation: Mother Baby Anesthesia Type: Epidural Level of consciousness: awake, awake and alert and oriented Pain management: pain level controlled Vital Signs Assessment: post-procedure vital signs reviewed and stable Respiratory status: spontaneous breathing, nonlabored ventilation and respiratory function stable Cardiovascular status: stable Postop Assessment: no headache, no backache, patient able to bend at knees, no signs of nausea or vomiting and adequate PO intake Anesthetic complications: no     Last Vitals:  Filed Vitals:   09/28/15 2345 09/29/15 1008  BP: 114/61 119/70  Pulse: 99 87  Temp: 37.1 C 36.8 C  Resp: 18 18    Last Pain:  Filed Vitals:   09/29/15 1015  PainSc: 2    Pain Goal:                 Yolanda Huffstetler

## 2015-09-29 NOTE — Progress Notes (Signed)
PPD 1 SVD/VAVD twins with 4th degree repair  S:  Reports feeling much better this AM.              Tolerating po/ No nausea or vomiting             Bleeding is decreasing.             Pain controlled with PO Motrin and Percocet.              Up ad lib / ambulatory / voiding QS / no BM   Newborn bottle fed after birth, plans to breastdfeed. :               VS: BP 114/61 mmHg  Pulse 99  Temp(Src) 98.7 F (37.1 C) (Oral)  Resp 18  Ht 5\' 1"  (1.549 m)  Wt 71.668 kg (158 lb)  BMI 29.87 kg/m2  SpO2 100%  LMP 12/25/2014 (Exact Date)  Breastfeeding? Unknown   LABS:              Recent Labs  09/28/15 1040 09/29/15 0512  WBC 7.2 9.0  HGB 12.0 9.7*  PLT 168 146*               Blood type: --/--/O POS (06/26 1040)  Rubella: Immune (12/07 0000)                     I&O: Intake/Output      06/26 0701 - 06/27 0700 06/27 0701 - 06/28 0700   Urine (mL/kg/hr) 300    Blood 500    Total Output 800     Net -800          Urine Occurrence 1 x                  Physical Exam:             Alert and oriented X3  Abdomen: soft, non-tender, non-distended              Fundus: firm, non-tender, @ U  Perineum: no edema  Lochia: small  Extremities: +2 pedal and up to knees edema, no calf pain or tenderness    A: PPD # 1              Hx PPD, was on sertraline until pregnancy  Doing well - stable status  P: Routine post partum orders  Bowel care - start colace and magnezium oxide as ordered             Sitz baths encouraged q shift   Consider restart SSRI w/ s/s of PPD or prophylactic, R/B discussed w/ client.              Anticipate DC in AM             Marlinda MikeBAILEY, Braxtin Bamba CNM, MSN, [facility]enter[facility]ACNM 09/29/2015, 8:44 AM

## 2015-09-29 NOTE — Lactation Note (Signed)
This note was copied from a baby's chart. Lactation Consultation Note  Patient Name: Bethany Mendoza: 09/29/2015 Reason for consult: Follow-up assessment;Multiple gestation;Other (Comment) (Early term 6-8.9 oz 1% weight loss , excessive edema )  MBU RN notified this LC Twin A was still not latching. LC felt due to baby being 20 hours old need for calories and being an early term infant Was indicated. Bethany Mendoza MBU RN assisted mom  And grandma with finger feeding 10 ml, per mom baby tolerated well. Presently sleeping.  HNV , stool x1 .  Baby B- baby is 5720 hours old and has only been to the breast for attempts, MBU RN notified LC of this data , and LC recommended starting to  Supplement due drops of EBM with pumping. Baby was finger fed at 1:45 pm with 10 ml / tolerated well and presently sleeping.  Voided X1 , HNS. No latches or latch scores as of yet.    Mom pumping both breast when LC entered the room. LC checked the flange size and #24 flange comfortable for mom and a  Good fit for today. Mom pumped for 20 mins , with scant amount of EBM yield in neck of flange, per mom planned to apply it to the baby's lips.  LC stressed the importance of feeding the baby at least every 3 hours, working on latching and supplementing afterwards using the guidelines  For Late Preterm, also trying an appetizer before latch. Post pump both breast for 20 mins , and hand express.  LC also encouraged mom to call for assistance with feeding cues or by 3 hours so RN or LC could assist .  Mom has hx of PIH , but not this pregnancy. Mom has excessive edema in both feet and ankles. LC encouraged mom to drink plenty of water, rest , And find balance between being up and having feed elevated to enhance decreasing swelling. LC stressed the importance of consistent pumping both breast  After working on the breast feeding, and to post pump whether the babies latch or not.  [facility]  [facility].  [facility].   LC feels these twins even though they are Early term ( 6-9.5 oz , and 7-2.8 oz ) at birth are  Acting more like Later Preterm infants, ( discussed with mom potential feeding  Behaviors and the importance of feeding at least every 3 hours or sooner with feeding cues.    Maternal Data    Feeding Feeding Type:  (perm om were finger fed formula at 145 pm  10 ml , tolerated well )  LATCH Score/Interventions                Intervention(s): Breastfeeding basics reviewed (see LC note )     Lactation Tools Discussed/Used Tools: Pump (MBU RN set up the DEBP ) Breast pump type: Double-Electric Breast Pump (mom pumping while LC present - #24 Flange a good fit )   Consult Status Consult Status: Follow-up Mendoza: 09/29/15 Follow-up type: In-patient    Bethany Mendoza, Bethany Mendoza 09/29/2015, 2:26 PM

## 2015-09-30 ENCOUNTER — Ambulatory Visit: Payer: Self-pay

## 2015-09-30 MED ORDER — DOCUSATE SODIUM 100 MG PO CAPS: 100.0000 mg | ORAL_CAPSULE | Freq: Two times a day (BID) | ORAL | Status: AC

## 2015-09-30 MED ORDER — IBUPROFEN 600 MG PO TABS: 600.0000 mg | ORAL_TABLET | Freq: Four times a day (QID) | ORAL | Status: AC

## 2015-09-30 MED ORDER — MAGNESIUM OXIDE 400 (241.3 MG) MG PO TABS: 400.0000 mg | ORAL_TABLET | Freq: Every day | ORAL | Status: AC

## 2015-09-30 MED ORDER — SERTRALINE HCL 50 MG PO TABS: 50.0000 mg | ORAL_TABLET | Freq: Every day | ORAL | Status: AC

## 2015-09-30 MED ORDER — OXYCODONE-ACETAMINOPHEN 5-325 MG PO TABS
1.0000 | ORAL_TABLET | ORAL | Status: AC | PRN
Start: 2015-09-30 — End: ?

## 2015-09-30 NOTE — Progress Notes (Signed)
Patient ID: Bethany Mendoza, female   DOB: [birth year], 40 y.o.   MRN: [id number] PPD #2 SVD / VAVD / Twins / 4th degree laceration      Information for the patient's newborn:  Otis BraceLewis, GirlA Arijana [[id number]][[id number]]  female Information for the patient's newborn:  Craige CottaLewis, GirlB Rebbie [[id number]][[id number]]  female   Feeding: breast  Subjective: No HA, SOB, CP, F/C, breast symptoms. Pain well-managed. Normal vaginal bleeding, no clots.      Objective:  Temp:  [97.8 F (36.6 C)-98.3 F (36.8 C)] 97.8 F (36.6 C) (06/28 0600) Pulse Rate:  [74-87] 74 (06/28 0600) Resp:  [18] 18 (06/28 0600) BP: (103-119)/(58-70) 103/58 mmHg (06/28 0600)  No intake or output data in the 24 hours ending 09/30/15 0847     Recent Labs  09/28/15 1040 09/29/15 0512  WBC 7.2 9.0  HGB 12.0 9.7*  HCT 36.7 29.2*  PLT 168 146*    Blood type: --/--/O POS (06/26 1040) Rubella: Immune (12/07 0000)    Physical Exam:  General: alert, cooperative, fatigued and no distress Uterine Fundus: firm, midline, U-1 Lochia: appropriate Perineum: 4th degree repair healing well, edema no DVT Evaluation: No evidence of DVT seen on physical exam. Negative Homan's sign. No cords or calf tenderness. Calf/Ankle edema is present.   Assessment/Plan: PPD # 2 / 40 y.o., [id number] S/P: spontaneous & vacuum extraction  Principal Problem:    Postpartum care following vaginal twin delivery -4th degree(6/26)  Active Problems:    History of precipitous labor and deliveries, antepartum    Perineal laceration with delivery, fourth degree, postpartum condition  H/O PPD, was on sertraline until pregnancy   Doing well - stable status   normal postpartum exam  Continue current postpartum care  Continue Bowel care - colace and magnesium oxide as ordered  Sitz baths encouraged every 8 hours  Start Zoloft 50 mg daily prophylactically, R/B and breastfeeding safety discussed w/ patient  D/C home          LOS: 2 days   Raelyn MoraAWSON, Mishti Swanton, M, MSN, CNM 09/30/2015, 8:47 AM

## 2015-09-30 NOTE — Lactation Note (Signed)
This note was copied from a baby's chart. Lactation Consultation Note Mom had been finger feeding since birth. Will not latch on breast. Mom is flat. Fitted w/#20NS SNS started, baby "B" BF great. Taught application, set up, and cleaning of SNS to mom and grandmother. alimentum given to baby in SNS. D/t baby's small mouth mom will need to flange lips out more, especally bottom lip. Taught mom chin tug. Taught mom "C" hold. Mom will only be able to BF w/SNS one baby at a time. Discussed with mom, SNS is temporary until milk comes in. Mom is post-pumping to induce lactation. Mom has edema to areola. Not compressible. Shells given to mom to wear between BF in bra.  Patient Name: Craige CottaGirlB Annasofia Sadler UJWJX'BToday's Date: 09/30/2015 Reason for consult: Follow-up assessment;Difficult latch   Maternal Data    Feeding Feeding Type: Formula Length of feed: 20 min  LATCH Score/Interventions Latch: Repeated attempts needed to sustain latch, nipple held in mouth throughout feeding, stimulation needed to elicit sucking reflex. Intervention(s): Adjust position;Assist with latch;Breast massage;Breast compression  Audible Swallowing: None Intervention(s): Skin to skin;Hand expression  Type of Nipple: Flat Intervention(s): Shells;Hand pump;Double electric pump  Comfort (Breast/Nipple): Filling, red/small blisters or bruises, mild/mod discomfort  Problem noted: Mild/Moderate discomfort Interventions (Mild/moderate discomfort): Post-pump;Hand massage;Hand expression;Breast shields  Hold (Positioning): Full assist, staff holds infant at breast  LATCH Score: 3  Lactation Tools Discussed/Used Tools: Shells;Nipple Shields;Pump;Supplemental Nutrition System Nipple shield size: 20 Shell Type: Inverted Breast pump type: Double-Electric Breast Pump Pump Review: Milk Storage;Setup, frequency, and cleaning Initiated by:: RN Date initiated:: 09/28/15   Consult Status Consult Status: Follow-up Date:  09/30/15 Follow-up type: In-patient    Mintie Witherington, Diamond NickelLAURA G 09/30/2015, 1:30 AM

## 2015-09-30 NOTE — Discharge Instructions (Signed)
Breast Pumping Tips If you are breastfeeding, there may be times when you cannot feed your baby directly. Returning to work or going on a trip are common examples. Pumping allows you to store breast milk and feed it to your baby later.  You may not get much milk when you first start to pump. Your breasts should start to make more after a few days. If you pump at the times you usually feed your baby, you may be able to keep making enough milk to feed your baby without also using formula. The more often you pump, the more milk you will produce. WHEN SHOULD I PUMP?   You can begin to pump soon after delivery. However, some experts recommend waiting about 4 weeks before giving your infant a bottle to make sure breastfeeding is going well.  If you plan to return to work, begin pumping a few weeks before. This will help you develop techniques that work best for you. It also lets you build up a supply of breast milk.   When you are with your infant, feed on demand and pump after each feeding.   When you are away from your infant for several hours, pump for about 15 minutes every 2-3 hours. Pump both breasts at the same time if you can.   If your infant has a formula feeding, make sure to pump around the same time.   If you drink any alcohol, wait 2 hours before pumping.  HOW DO I PREPARE TO PUMP? Your let-down reflexis the natural reaction to stimulation that makes your breast milk flow. It is easier to stimulate this reflex when you are relaxed. Find relaxation techniques that work for you. If you have difficulty with your let-down reflex, try these methods:   Smell one of your infant's blankets or an item of clothing.   Look at a picture or video of your infant.   Sit in a quiet, private space.   Massage the breast you plan to pump.   Place soothing warmth on the breast.   Play relaxing music.  WHAT ARE SOME GENERAL BREAST PUMPING TIPS?  Wash your hands before you pump. You  do not need to wash your nipples or breasts.  There are three ways to pump.  You can use your hand to massage and compress your breast.  You can use a handheld manual pump.  You can use an electric pump.   Make sure the suction cup (flange) on the breast pump is the right size. Place the flange directly over the nipple. If it is the wrong size or placed the wrong way, it may be painful and cause nipple damage.   If pumping is uncomfortable, apply a small amount of purified or modified lanolin to your nipple and areola.  If you are using an electric pump, adjust the speed and suction power to be more comfortable.  If pumping is painful or if you are not getting very much milk, you may need a different type of pump. A lactation consultant can help you determine what type of pump to use.   Keep a full water bottle near you at all times. Drinking lots of fluid helps you make more milk.  You can store your milk to use later. Pumped breast milk can be stored in a sealable, sterile container or plastic bag. Label all stored breast milk with the date you pumped it.  Milk can stay out at room temperature for up to 8 hours.  You can store your milk in the refrigerator for up to 8 days.  You can store your milk in the freezer for 3 months. Thaw frozen milk using warm water. Do not put it in the microwave.  Do not smoke. Smoking can lower your milk supply and harm your infant. If you need help quitting, ask your health care provider to recommend a program.  [place] A LACTATION CONSULTANT?  You are having trouble pumping.  You are concerned that you are not making enough milk.  You have nipple pain, soreness, or redness.  You want to use birth control. Birth control pills may lower your milk supply. Talk to your health care provider about your options.   This information is not intended to replace advice given to you by your health care provider.  Make sure you discuss any questions you have with your health care provider.   Document Released: 09/08/2009 Document Revised: 03/26/2013 Document Reviewed: 01/11/2013 Elsevier Interactive Patient Education 2016 [place] of a Perineal Tear A perineal tear is a cut (laceration) in the tissue between the opening of the vagina and the anus (perineum). Some women naturally develop a perineal tear during a vaginal birth. This can happen as the baby emerges from the birth canal and the perineum is stretched. Perineal tears are graded based on how deep and long the laceration is. The grading for perineal tears is as follows:  First degree. This involves a shallow tear at the edge of the vaginal opening that extends slightly into the perineal skin.  Second degree. This involves tearing described in a first degree perineal tear and also a deeper tear of the vaginal opening and perineal tissues. It may also include tearing of a muscle just under the perineal skin.  Third degree. This involves tearing described in a first and second degree perineal tear, with the tear extending into the muscle of the anus (anal sphincter).  Fourth degree. This involves all levels of tear described for first, second, and third degree perineal tear, with the tear extending into the rectum. First degree perineal tears may or may not be stitched closed, depending on their location and appearance. Second, third, and fourth degree perineal tears are stitched closed immediately after the baby's birth. RISKS AND COMPLICATIONS Depending on the type of perineal tear you have, you may be at risk for the following:  Bleeding.  Developing a collection of blood in the perineal tear area (hematoma).  Pain. This may include pain with urination or bowel movements.  Infection at the site of the tear.  Fever.  Trouble controlling your bowels (fecal incontinence).  Painful sexual intercourse. HOW TO CARE FOR A PERINEAL  TEAR  The first day, put ice on the area of the tear.  Put ice in a plastic bag.  Place a towel between your skin and the bag.  Leave the ice on for 20 minutes, 2-3 times a day.  Bathe using a warm sitz bath as directed by your health care provider. This can speed up healing. Sitz baths can be performed in your bathtub or using a sitz bath kit that fits over your toilet.  Place 3-4 in. (7.6-10 cm) of warm water in your bathtub or fill the sitz bath over-the-toilet container with warm water. Make sure the water is not too hot by placing a drop on your wrist.  Sit in the warm water for 20-30 minutes.  After bathing, pat your  perineum dry with a clean towel. Do not scrub the perineum as this could cause pain, irritation, or open any stitches you may have.  Keep the over-the-toilet sitz bath container clean by rinsing it thoroughly after each use. Ask for help in keeping the bathtub clean with diluted bleach and water (2 Tbsp [30 mL] of bleach to  gal [1.9 L] of water).  Repeat the sitz bath as often as you would like to relieve perineal pain, itching, or discomfort.  Apply a numbing spray to the perineal tear site as directed by your health care provider. This may help with discomfort.  Wash your hands before and after applying medicine to the area.  Put about 3 witch hazel-containing hemorrhoid treatment pads on top of your sanitary pad. The witch hazel in the hemorrhoid pads helps with discomfort and swelling.  Get a squeeze bottle to squeeze warm water on your perineum when urinating, spraying the area from front to back. Pat the area to dry it.  Sitting on an inflatable ring or pillow may provide comfort.  Take medicines only as directed by your health care provider.  Do not have sexual intercourse or use tampons until your health care provider says it is okay. Typically, you must wait at least 6 weeks.  Keep all postpartum appointments as directed by your health care  provider. SEEK MEDICAL CARE IF:   Your pain is not relieved with medicines.  You have painful urination.  You have a fever. SEEK IMMEDIATE MEDICAL CARE IF:   You have redness, swelling, or increasing pain in the area of the tear.  You have pus coming from the area of the tear.  You notice a bad smell coming from the area of the tear.  Your tear opens.  You notice swelling in the area of the tear that is larger than when you left the hospital.  You cannot urinate.   This information is not intended to replace advice given to you by your health care provider. Make sure you discuss any questions you have with your health care provider.   Document Released: 08/05/2013 Document Revised: 04/11/2014 Document Reviewed: 08/05/2013 Elsevier Interactive Patient Education 2016 [employer]. Postpartum Depression and Baby Blues The postpartum period begins right after the birth of a baby. During this time, there is often a great amount of joy and excitement. It is also a time of many changes in the life of the parents. Regardless of how many times a mother gives birth, each child brings new challenges and dynamics to the family. It is not unusual to have feelings of excitement along with confusing shifts in moods, emotions, and thoughts. All mothers are at risk of developing postpartum depression or the "baby blues." These mood changes can occur right after giving birth, or they may occur many months after giving birth. The baby blues or postpartum depression can be mild or severe. Additionally, postpartum depression can go away rather quickly, or it can be a long-term condition.  CAUSES Raised hormone levels and the rapid drop in those levels are thought to be a main cause of postpartum depression and the baby blues. A number of hormones change during and after pregnancy. Estrogen and progesterone usually decrease right after the delivery of your baby. The levels of thyroid hormone and various  cortisol steroids also rapidly drop. Other factors that play a role in these mood changes include major life events and genetics.  RISK FACTORS If you have any of the following risks for  the baby blues or postpartum depression, know what symptoms to watch out for during the postpartum period. Risk factors that may increase the likelihood of getting the baby blues or postpartum depression include:  Having a personal or family history of depression.   Having depression while being pregnant.   Having premenstrual mood issues or mood issues related to oral contraceptives.  Having a lot of life stress.   Having marital conflict.   Lacking a social support network.   Having a baby with special needs.   Having health problems, such as diabetes.  SIGNS AND SYMPTOMS Symptoms of baby blues include:  Brief changes in mood, such as going from extreme happiness to sadness.  Decreased concentration.   Difficulty sleeping.   Crying spells, tearfulness.   Irritability.   Anxiety.  Symptoms of postpartum depression typically begin within the first month after giving birth. These symptoms include:  Difficulty sleeping or excessive sleepiness.   Marked weight loss.   Agitation.   Feelings of worthlessness.   Lack of interest in activity or food.  Postpartum psychosis is a very serious condition and can be dangerous. Fortunately, it is rare. Displaying any of the following symptoms is cause for immediate medical attention. Symptoms of postpartum psychosis include:   Hallucinations and delusions.   Bizarre or disorganized behavior.   Confusion or disorientation.  DIAGNOSIS  A diagnosis is made by an evaluation of your symptoms. There are no medical or lab tests that lead to a diagnosis, but there are various questionnaires that a health care provider may use to identify those with the baby blues, postpartum depression, or psychosis. Often, a screening tool called  the [place] Postnatal Depression Scale is used to diagnose depression in the postpartum period.  TREATMENT The baby blues usually goes away on its own in 1-2 weeks. Social support is often all that is needed. You will be encouraged to get adequate sleep and rest. Occasionally, you may be given medicines to help you sleep.  Postpartum depression requires treatment because it can last several months or longer if it is not treated. Treatment may include individual or group therapy, medicine, or both to address any social, physiological, and psychological factors that may play a role in the depression. Regular exercise, a healthy diet, rest, and social support may also be strongly recommended.  Postpartum psychosis is more serious and needs treatment right away. Hospitalization is often needed. HOME CARE INSTRUCTIONS  Get as much rest as you can. Nap when the baby sleeps.   Exercise regularly. Some women find yoga and walking to be beneficial.   Eat a balanced and nourishing diet.   Do little things that you enjoy. Have a cup of tea, take a bubble bath, read your favorite magazine, or listen to your favorite music.  Avoid alcohol.   Ask for help with household chores, cooking, grocery shopping, or running errands as needed. Do not try to do everything.   Talk to people close to you about how you are feeling. Get support from your partner, family members, friends, or other new moms.  Try to stay positive in how you think. Think about the things you are grateful for.   Do not spend a lot of time alone.   Only take over-the-counter or prescription medicine as directed by your health care provider.  Keep all your postpartum appointments.   Let your health care provider know if you have any concerns.  SEEK MEDICAL CARE IF: You are having a reaction to  or problems with your medicine. SEEK IMMEDIATE MEDICAL CARE IF:  You have suicidal feelings.   You think you may harm the baby  or someone else. MAKE SURE YOU:  Understand these instructions.  Will watch your condition.  Will get help right away if you are not doing well or get worse.   This information is not intended to replace advice given to you by your health care provider. Make sure you discuss any questions you have with your health care provider.   Document Released: 12/24/2003 Document Revised: 03/26/2013 Document Reviewed: 12/31/2012 Elsevier Interactive Patient Education 2016 [place]. Postpartum Care After Vaginal Delivery After you deliver your newborn (postpartum period), the usual stay in the hospital is 24-72 hours. If there were problems with your labor or delivery, or if you have other medical problems, you might be in the hospital longer.  While you are in the hospital, you will receive help and instructions on how to care for yourself and your newborn during the postpartum period.  While you are in the hospital:  Be sure to tell your nurses if you have pain or discomfort, as well as where you feel the pain and what makes the pain worse.  If you had an incision made near your vagina (episiotomy) or if you had some tearing during delivery, the nurses may put ice packs on your episiotomy or tear. The ice packs may help to reduce the pain and swelling.  If you are breastfeeding, you may feel uncomfortable contractions of your uterus for a couple of weeks. This is normal. The contractions help your uterus get back to normal size.  It is normal to have some bleeding after delivery.  For the first 1-3 days after delivery, the flow is red and the amount may be similar to a period.  It is common for the flow to start and stop.  In the first few days, you may pass some small clots. Let your nurses know if you begin to pass large clots or your flow increases.  Do not  flush blood clots down the toilet before having the nurse look at them.  During the next 3-10 days after delivery, your flow  should become more watery and pink or brown-tinged in color.  Ten to fourteen days after delivery, your flow should be a small amount of yellowish-white discharge.  The amount of your flow will decrease over the first few weeks after delivery. Your flow may stop in 6-8 weeks. Most women have had their flow stop by 12 weeks after delivery.  You should change your sanitary pads frequently.  Wash your hands thoroughly with soap and water for at least 20 seconds after changing pads, using the toilet, or before holding or feeding your newborn.  You should feel like you need to empty your bladder within the first 6-8 hours after delivery.  In case you become weak, lightheaded, or faint, call your nurse before you get out of bed for the first time and before you take a shower for the first time.  Within the first few days after delivery, your breasts may begin to feel tender and full. This is called engorgement. Breast tenderness usually goes away within 48-72 hours after engorgement occurs. You may also notice milk leaking from your breasts. If you are not breastfeeding, do not stimulate your breasts. Breast stimulation can make your breasts produce more milk.  Spending as much time as possible with your newborn is very important. During this time, you  and your newborn can feel close and get to know each other. Having your newborn stay in your room (rooming in) will help to strengthen the bond with your newborn. It will give you time to get to know your newborn and become comfortable caring for your newborn.  Your hormones change after delivery. Sometimes the hormone changes can temporarily cause you to feel sad or tearful. These feelings should not last more than a few days. If these feelings last longer than that, you should talk to your caregiver.  If desired, talk to your caregiver about methods of family planning or contraception.  Talk to your caregiver about immunizations. Your caregiver may  want you to have the following immunizations before leaving the hospital:  Tetanus, diphtheria, and pertussis (Tdap) or tetanus and diphtheria (Td) immunization. It is very important that you and your family (including grandparents) or others caring for your newborn are up-to-date with the Tdap or Td immunizations. The Tdap or Td immunization can help protect your newborn from getting ill.  Rubella immunization.  Varicella (chickenpox) immunization.  Influenza immunization. You should receive this annual immunization if you did not receive the immunization during your pregnancy.   This information is not intended to replace advice given to you by your health care provider. Make sure you discuss any questions you have with your health care provider.   Document Released: 01/16/2007 Document Revised: 12/14/2011 Document Reviewed: 11/16/2011 Elsevier Interactive Patient Education 2016 [employer] Breastfeeding and Mastitis Mastitis is inflammation of the breast tissue. It can occur in women who are breastfeeding. This can make breastfeeding painful. Mastitis will sometimes go away on its own. Your health care provider will help determine if treatment is needed. CAUSES Mastitis is often associated with a blocked milk (lactiferous) duct. This can happen when too much milk builds up in the breast. Causes of excess milk in the breast can include:  Poor latch-on. If your baby is not latched onto the breast properly, she or he may not empty your breast completely while breastfeeding.  Allowing too much time to pass between feedings.  Wearing a bra or other clothing that is too tight. This puts extra pressure on the lactiferous ducts so milk does not flow through them as it should. Mastitis can also be caused by a bacterial infection. Bacteria may enter the breast tissue through cuts or openings in the skin. In women who are breastfeeding, this may occur because of cracked or irritated skin. Cracks  in the skin are often caused when your baby does not latch on properly to the breast. SIGNS AND SYMPTOMS  Swelling, redness, tenderness, and pain in an area of the breast.  Swelling of the glands under the arm on the same side.  Fever may or may not accompany mastitis. If an infection is allowed to progress, a collection of pus (abscess) may develop. DIAGNOSIS  Your health care provider can usually diagnose mastitis based on your symptoms and a physical exam. Tests may be done to help confirm the diagnosis. These may include:  Removal of pus from the breast by applying pressure to the area. This pus can be examined in the lab to determine which bacteria are present. If an abscess has developed, the fluid in the abscess can be removed with a needle. This can also be used to confirm the diagnosis and determine the bacteria present. In most cases, pus will not be present.  Blood tests to determine if your body is fighting a bacterial infection.  Mammogram or ultrasound tests to rule out other problems or diseases. TREATMENT  Mastitis that occurs with breastfeeding will sometimes go away on its own. Your health care provider may choose to wait 24 hours after first seeing you to decide whether a prescription medicine is needed. If your symptoms are worse after 24 hours, your health care provider will likely prescribe an antibiotic medicine to treat the mastitis. He or she will determine which bacteria are most likely causing the infection and will then select an appropriate antibiotic medicine. This is sometimes changed based on the results of tests performed to identify the bacteria, or if there is no response to the antibiotic medicine selected. Antibiotic medicines are usually given by mouth. You may also be given medicine for pain. HOME CARE INSTRUCTIONS  Only take over-the-counter or prescription medicines for pain, fever, or discomfort as directed by your health care provider.  If your health  care provider prescribed an antibiotic medicine, take the medicine as directed. Make sure you finish it even if you start to feel better.  Do not wear a tight or underwire bra. Wear a soft, supportive bra.  Increase your fluid intake, especially if you have a fever.  Continue to empty the breast. Your health care provider can tell you whether this milk is safe for your infant or needs to be thrown out. You may be told to stop nursing until your health care provider thinks it is safe for your baby. Use a breast pump if you are advised to stop nursing.  Keep your nipples clean and dry.  Empty the first breast completely before going to the other breast. If your baby is not emptying your breasts completely for some reason, use a breast pump to empty your breasts.  If you go back to work, pump your breasts while at work to stay in time with your nursing schedule.  Avoid allowing your breasts to become overly filled with milk (engorged). SEEK MEDICAL CARE IF:  You have pus-like discharge from the breast.  Your symptoms do not improve with the treatment prescribed by your health care provider within 2 days. SEEK IMMEDIATE MEDICAL CARE IF:  Your pain and swelling are getting worse.  You have pain that is not controlled with medicine.  You have a red line extending from the breast toward your armpit.  You have a fever or persistent symptoms for more than 2-3 days.  You have a fever and your symptoms suddenly get worse. MAKE SURE YOU:   Understand these instructions.  Will watch your condition.  Will get help right away if you are not doing well or get worse.   This information is not intended to replace advice given to you by your health care provider. Make sure you discuss any questions you have with your health care provider.   Document Released: 07/16/2004 Document Revised: 03/26/2013 Document Reviewed: 10/25/2012 Elsevier Interactive Patient Education [employer]. Breastfeeding Deciding to breastfeed is one of the best choices you can make for you and your baby. A change in hormones during pregnancy causes your breast tissue to grow and increases the number and size of your milk ducts. These hormones also allow proteins, sugars, and fats from your blood supply to make breast milk in your milk-producing glands. Hormones prevent breast milk from being released before your baby is born as well as prompt milk flow after birth. Once breastfeeding has begun, thoughts of your baby, as well as his or her sucking or crying,  can stimulate the release of milk from your milk-producing glands.  BENEFITS OF BREASTFEEDING For Your Baby  Your first milk (colostrum) helps your baby's digestive system function better.  There are antibodies in your milk that help your baby fight off infections.  Your baby has a lower incidence of asthma, allergies, and sudden infant death syndrome.  The nutrients in breast milk are better for your baby than infant formulas and are designed uniquely for your baby's needs.  Breast milk improves your baby's brain development.  Your baby is less likely to develop other conditions, such as childhood obesity, asthma, or type 2 diabetes mellitus. For You  Breastfeeding helps to create a very special bond between you and your baby.  Breastfeeding is convenient. Breast milk is always available at the correct temperature and costs nothing.  Breastfeeding helps to burn calories and helps you lose the weight gained during pregnancy.  Breastfeeding makes your uterus contract to its prepregnancy size faster and slows bleeding (lochia) after you give birth.   Breastfeeding helps to lower your risk of developing type 2 diabetes mellitus, osteoporosis, and breast or ovarian cancer later in life. SIGNS THAT YOUR BABY IS HUNGRY Early Signs of Hunger  Increased alertness or activity.  Stretching.  Movement of the head from side to  side.  Movement of the head and opening of the mouth when the corner of the mouth or cheek is stroked (rooting).  Increased sucking sounds, smacking lips, cooing, sighing, or squeaking.  Hand-to-mouth movements.  Increased sucking of fingers or hands. Late Signs of Hunger  Fussing.  Intermittent crying. Extreme Signs of Hunger Signs of extreme hunger will require calming and consoling before your baby will be able to breastfeed successfully. Do not wait for the following signs of extreme hunger to occur before you initiate breastfeeding:  Restlessness.  A loud, strong cry.  Screaming. BREASTFEEDING BASICS Breastfeeding Initiation  Find a comfortable place to sit or lie down, with your neck and back well supported.  Place a pillow or rolled up blanket under your baby to bring him or her to the level of your breast (if you are seated). Nursing pillows are specially designed to help support your arms and your baby while you breastfeed.  Make sure that your baby's abdomen is facing your abdomen.  Gently massage your breast. With your fingertips, massage from your chest wall toward your nipple in a circular motion. This encourages milk flow. You may need to continue this action during the feeding if your milk flows slowly.  Support your breast with 4 fingers underneath and your thumb above your nipple. Make sure your fingers are well away from your nipple and your baby's mouth.  Stroke your baby's lips gently with your finger or nipple.  When your baby's mouth is open wide enough, quickly bring your baby to your breast, placing your entire nipple and as much of the colored area around your nipple (areola) as possible into your baby's mouth.  More areola should be visible above your baby's upper lip than below the lower lip.  Your baby's tongue should be between his or her lower gum and your breast.  Ensure that your baby's mouth is correctly positioned around your nipple  (latched). Your baby's lips should create a seal on your breast and be turned out (everted).  It is common for your baby to suck about 2-3 minutes in order to start the flow of breast milk. Latching Teaching your baby how to latch on to  your breast properly is very important. An improper latch can cause nipple pain and decreased milk supply for you and poor weight gain in your baby. Also, if your baby is not latched onto your nipple properly, he or she may swallow some air during feeding. This can make your baby fussy. Burping your baby when you switch breasts during the feeding can help to get rid of the air. However, teaching your baby to latch on properly is still the best way to prevent fussiness from swallowing air while breastfeeding. Signs that your baby has successfully latched on to your nipple:  Silent tugging or silent sucking, without causing you pain.  Swallowing heard between every 3-4 sucks.  Muscle movement above and in front of his or her ears while sucking. Signs that your baby has not successfully latched on to nipple:  Sucking sounds or smacking sounds from your baby while breastfeeding.  Nipple pain. If you think your baby has not latched on correctly, slip your finger into the corner of your baby's mouth to break the suction and place it between your baby's gums. Attempt breastfeeding initiation again. Signs of Successful Breastfeeding Signs from your baby:  A gradual decrease in the number of sucks or complete cessation of sucking.  Falling asleep.  Relaxation of his or her body.  Retention of a small amount of milk in his or her mouth.  Letting go of your breast by himself or herself. Signs from you:  Breasts that have increased in firmness, weight, and size 1-3 hours after feeding.  Breasts that are softer immediately after breastfeeding.  Increased milk volume, as well as a change in milk consistency and color by the fifth day of breastfeeding.  Nipples  that are not sore, cracked, or bleeding. Signs That Your Randel Books is Getting Enough Milk  Wetting at least 3 diapers in a 24-hour period. The urine should be clear and pale yellow by age 20 days.  At least 3 stools in a 24-hour period by age 20 days. The stool should be soft and yellow.  At least 3 stools in a 24-hour period by age 57 days. The stool should be seedy and yellow.  No loss of weight greater than 10% of birth weight during the first 41 days of age.  Average weight gain of 4-7 ounces (113-198 g) per week after age 50 days.  Consistent daily weight gain by age 93 days, without weight loss after the age of 2 weeks. After a feeding, your baby may spit up a small amount. This is common. BREASTFEEDING FREQUENCY AND DURATION Frequent feeding will help you make more milk and can prevent sore nipples and breast engorgement. Breastfeed when you feel the need to reduce the fullness of your breasts or when your baby shows signs of hunger. This is called "breastfeeding on demand." Avoid introducing a pacifier to your baby while you are working to establish breastfeeding (the first 4-6 weeks after your baby is born). After this time you may choose to use a pacifier. Research has shown that pacifier use during the first year of a baby's life decreases the risk of sudden infant death syndrome (SIDS). Allow your baby to feed on each breast as long as he or she wants. Breastfeed until your baby is finished feeding. When your baby unlatches or falls asleep while feeding from the first breast, offer the second breast. Because newborns are often sleepy in the first few weeks of life, you may need to awaken your baby to  get him or her to feed. Breastfeeding times will vary from baby to baby. However, the following rules can serve as a guide to help you ensure that your baby is properly fed:  Newborns (babies 52 weeks of age or younger) may breastfeed every 1-3 hours.  Newborns should not go longer than 3 hours  during the day or 5 hours during the night without breastfeeding.  You should breastfeed your baby a minimum of 8 times in a 24-hour period until you begin to introduce solid foods to your baby at around 69 months of age. BREAST MILK PUMPING Pumping and storing breast milk allows you to ensure that your baby is exclusively fed your breast milk, even at times when you are unable to breastfeed. This is especially important if you are going back to work while you are still breastfeeding or when you are not able to be present during feedings. Your lactation consultant can give you guidelines on how long it is safe to store breast milk. A breast pump is a machine that allows you to pump milk from your breast into a sterile bottle. The pumped breast milk can then be stored in a refrigerator or freezer. Some breast pumps are operated by hand, while others use electricity. Ask your lactation consultant which type will work best for you. Breast pumps can be purchased, but some hospitals and breastfeeding support groups lease breast pumps on a monthly basis. A lactation consultant can teach you how to hand express breast milk, if you prefer not to use a pump. CARING FOR YOUR BREASTS WHILE YOU BREASTFEED Nipples can become dry, cracked, and sore while breastfeeding. The following recommendations can help keep your breasts moisturized and healthy:  Avoid using soap on your nipples.  Wear a supportive bra. Although not required, special nursing bras and tank tops are designed to allow access to your breasts for breastfeeding without taking off your entire bra or top. Avoid wearing underwire-style bras or extremely tight bras.  Air dry your nipples for 3-39mnutes after each feeding.  Use only cotton bra pads to absorb leaked breast milk. Leaking of breast milk between feedings is normal.  Use lanolin on your nipples after breastfeeding. Lanolin helps to maintain your skin's normal moisture barrier. If you use  pure lanolin, you do not need to wash it off before feeding your baby again. Pure lanolin is not toxic to your baby. You may also hand express a few drops of breast milk and gently massage that milk into your nipples and allow the milk to air dry. In the first few weeks after giving birth, some women experience extremely full breasts (engorgement). Engorgement can make your breasts feel heavy, warm, and tender to the touch. Engorgement peaks within 3-5 days after you give birth. The following recommendations can help ease engorgement:  Completely empty your breasts while breastfeeding or pumping. You may want to start by applying warm, moist heat (in the shower or with warm water-soaked hand towels) just before feeding or pumping. This increases circulation and helps the milk flow. If your baby does not completely empty your breasts while breastfeeding, pump any extra milk after he or she is finished.  Wear a snug bra (nursing or regular) or tank top for 1-2 days to signal your body to slightly decrease milk production.  Apply ice packs to your breasts, unless this is too uncomfortable for you.  Make sure that your baby is latched on and positioned properly while breastfeeding. If engorgement persists after  48 hours of following these recommendations, contact your health care provider or a Science writer. OVERALL HEALTH CARE RECOMMENDATIONS WHILE BREASTFEEDING  Eat healthy foods. Alternate between meals and snacks, eating 3 of each per day. Because what you eat affects your breast milk, some of the foods may make your baby more irritable than usual. Avoid eating these foods if you are sure that they are negatively affecting your baby.  Drink milk, fruit juice, and water to satisfy your thirst (about 10 glasses a day).  Rest often, relax, and continue to take your prenatal vitamins to prevent fatigue, stress, and anemia.  Continue breast self-awareness checks.  Avoid chewing and smoking  tobacco. Chemicals from cigarettes that pass into breast milk and exposure to secondhand smoke may harm your baby.  Avoid alcohol and drug use, including marijuana. Some medicines that may be harmful to your baby can pass through breast milk. It is important to ask your health care provider before taking any medicine, including all over-the-counter and prescription medicine as well as vitamin and herbal supplements. It is possible to become pregnant while breastfeeding. If birth control is desired, ask your health care provider about options that will be safe for your baby. SEEK MEDICAL CARE IF:  You feel like you want to stop breastfeeding or have become frustrated with breastfeeding.  You have painful breasts or nipples.  Your nipples are cracked or bleeding.  Your breasts are red, tender, or warm.  You have a swollen area on either breast.  You have a fever or chills.  You have nausea or vomiting.  You have drainage other than breast milk from your nipples.  Your breasts do not become full before feedings by the fifth day after you give birth.  You feel sad and depressed.  Your baby is too sleepy to eat well.  Your baby is having trouble sleeping.   Your baby is wetting less than 3 diapers in a 24-hour period.  Your baby has less than 3 stools in a 24-hour period.  Your baby's skin or the white part of his or her eyes becomes yellow.   Your baby is not gaining weight by 63 days of age. SEEK IMMEDIATE MEDICAL CARE IF:  Your baby is overly tired (lethargic) and does not want to wake up and feed.  Your baby develops an unexplained fever.   This information is not intended to replace advice given to you by your health care provider. Make sure you discuss any questions you have with your health care provider.   Document Released: 03/21/2005 Document Revised: 12/10/2014 Document Reviewed: 09/12/2012 Elsevier Interactive Patient Education [employer].

## 2015-09-30 NOTE — Progress Notes (Signed)
Patient will be told to follow up on 2 weeks for a 4th degree check.

## 2015-09-30 NOTE — Progress Notes (Signed)
Bethany Mendoza. Dawson aware of patient's pedal edema and swelling in legs. Patient told to hydrate and elevate her legs. No new orders at that time.

## 2015-09-30 NOTE — Lactation Note (Signed)
This note was copied from a baby's chart. Lactation Consultation Note  Patient Name: Otis BraceGirlA Phallon Sutphen ZOXWR'UToday's Date: 09/30/2015    Baby A - per mom has been sluggish with her feedings and acts like she doesn't care to feed.  Has latched with a NS, and SNS but takes a very long time to feed , the syringe and finger feeding  Seems to work better.  LC Impression: Baby A -  has a very small mouth, placed skin to skin after diaper check, baby awake  And latched with a #24 NS, Appetizer of formula instilled into the top 4ml ,  chin eased open, lips flanged  And baby fed for 15 mins and stayed in an active patterns with swallows noted, and scant colostrum noted  In the NS after wards. Tried the SNS after the baby released 1st time without success.  Finished feeding with Artifical nipple by tickling upper lip to challenge baby to open wide , eased down chin and  baby pace fed well. ( as consult was ending grandmother was finishing feeding - MBU RN aware to document total amount)  21 ml in the bottle. Per mom baby was the most awake and LC  Noted the baby to actively participate at the breast and on the bottle. Mom seemed very excited the baby fed so well at the breast and pace feeding. Baby more alert.   LC plan for Baby A -  Feed skin to skin feeding until the baby can stay awake for the feeding.  If she is sluggish to start - feed her an appetizer from a bottle ( wake her mouth up )  Then latch at the breast with NS #24 , with SNS on top.  May have to assist mom to obtain the depth and then turn on the SNS.  Feed at the breast 15 -20 mins max - if she is sluggish at the breast - may just have to release her and finish feeding with Artifical nipple.  ( Important - not to tirer this baby out feeding - and the consistent supplementing afterwards is for calories she doesn't have to work hard for  To enhance increased energy , and decrease weight loss, and to make them stronger feeders at the breast.    Baby B - per mom has been the more active baby and she likes to eat, has latched with the NS and SNS , but the SNS seems to take a long time.  The most supplementing she has taken is 25 ml.   Lactation Impression: Baby B awake after diaper change by LC ( large wet and small [place] )  LC placed baby to the breast with a #24 NS - appetizer of formula , baby latched and fed in a participating pattern for 8 mins , released to apply SNS  Over the NS , and the baby only took 5 ml after 7 mins. Released suction and remainder of feeding fed from a bottle . Total volume at this feeding 28 ml  And 15 mins at the breast with colostrum noted in the NS after feeding. Mom seemed very excited the baby fed so well.   LC plan of Care for Baby B -  Skin to skin feedings until the baby can stay awake for a feeding.  If sluggish but awake may have to give appetizer and then latch with a #24 NS ,  Also instilling formula into the top of the NS. Applying the SNS on the  top of the NS,  Feed the baby max 15 -20 mins and if the baby doesn't finish the supplement at the breast  To place the remainder in a bottle with artifical nipple - pace feed.   For Baby A and Baby B - if it has been 3 hours and the baby's aren't awake , wake them up , check and change diaper,  Attempt to latch - ( don't try more than 10 mins - don't tirer the babies out trying)  Go ahead and feed the supplement 25 -30 ml , if they are wide awake and rooting , try latching , if not call it a feeding  And have mom pump both breast 15 -20 mins , save milk for next feeding.  Goal is to work on both babies to open wider  Encouraged belly time on moms chest .   LC discussed with mom the importance of being consistent with her pumping both breast after every feeding until  The babies get more consistent to bring the milk in , once the milk comes in , babies are more consistent , pumping  Can be decreased. Drink plenty water due to the excessive edema in  feet , ankles and legs, elevate legs as much as possible.  Goal is to establish and protect milk supply.  EBM obtained can be fed back to both babies.      Maternal Data    Feeding    LATCH Score/Interventions                      Lactation Tools Discussed/Used     Consult Status      Kathrin Greathouseorio, Dalayla Aldredge Ann 09/30/2015, 5:01 PM

## 2015-09-30 NOTE — Lactation Note (Signed)
This note was copied from a baby's chart. Lactation Consultation Note Experienced BF mom used NS for her son d/t flat nipples. Jonna ClarkLillie is sleepy and not interested in BF. She had  2 emesis of large curdled milk. Positioned at breast prior to emesis. Wouldn't suckle on breast. Grandma took care of [name] d/t spitty. When Jonna ClarkLillie is cueing and feeling better, will put her to the breast w/SNS.  Patient Name: Bethany BraceGirlA Charlesetta Graf BJYNW'GToday's Date: 09/30/2015 Reason for consult: Follow-up assessment;Difficult latch   Maternal Data    Feeding    LATCH Score/Interventions Latch: Too sleepy or reluctant, no latch achieved, no sucking elicited. Intervention(s): Skin to skin;Teach feeding cues;Waking techniques Intervention(s): Adjust position;Assist with latch;Breast massage;Breast compression  Audible Swallowing: None Intervention(s): Hand expression;Skin to skin  Type of Nipple: Flat Intervention(s): Shells;Double electric pump;Hand pump  Comfort (Breast/Nipple): Filling, red/small blisters or bruises, mild/mod discomfort  Problem noted: Mild/Moderate discomfort Interventions (Mild/moderate discomfort): Hand massage;Hand expression;Post-pump;Breast shields  Hold (Positioning): Full assist, staff holds infant at breast Intervention(s): Breastfeeding basics reviewed;Support Pillows;Position options;Skin to skin  LATCH Score: 2  Lactation Tools Discussed/Used Tools: Shells;Pump;Supplemental Nutrition System Nipple shield size: 20 Breast pump type: Double-Electric Breast Pump Pump Review: Milk Storage   Consult Status Consult Status: Follow-up Date: 09/30/15 Follow-up type: In-patient    Charlynn Salih, Diamond NickelLAURA G 09/30/2015, 1:23 AM

## 2015-09-30 NOTE — Discharge Summary (Signed)
OB Discharge Summary     Patient Name: Bethany PedroBrandi S Rahe DOB: [birth year] MRN: [id number]  Date of admission: 09/28/2015 Delivering MD:    Otis BraceLewis, GirlA Galilee [[id number]][[id number]]  Concha NorwayAAVON, RICHARD   Eley, GirlB Mayar [[id number]][[id number]]  Olivia MackieAAVON, RICHARD   Date of discharge: 09/30/2015  Admitting diagnosis: INDUCTION due to advanced dilatation and history of precipitous labor / DIDI twin gestation Intrauterine pregnancy: 4929w0d     Secondary diagnosis:  Principal Problem:   Postpartum care following vaginal twin delivery -4th degree(6/26) Active Problems:   History of precipitous labor and deliveries, antepartum   Perineal laceration with delivery, fourth degree, postpartum condition  Additional problems: none     Discharge diagnosis: Term Pregnancy Delivered                                                                                                Post partum procedures:none  Augmentation: AROM and Pitocin  Complications: None  Hospital course:  Induction of Labor With Vaginal Delivery   40 y.o. yo [phone] at 4529w0d was admitted to the hospital 09/28/2015 for induction of labor.  Indication for induction: multiple gestation.  Patient had an uncomplicated labor course as follows: Membrane Rupture Time/Date:    Otis BraceLewis, GirlA Nandi [[id number]][[id number]]  2:46 PM   Craige CottaLewis, GirlB Niketa [[id number]][[id number]]  5:10 PM ,   Otis BraceLewis, GirlA Lania [[id number]][[id number]]  09/28/2015   Craige CottaLewis, GirlB Jisella [[id number]][[id number]]  09/28/2015   Intrapartum Procedures: Episiotomy:    Otis BraceLewis, GirlA Keryl [[id number]][[id number]]  None [1]   Craige CottaLewis, GirlB Telesha [[id number]][[id number]]  None [1]                                         Lacerations:     Otis BraceLewis, GirlA Berdene [[id number]][[id number]]  4th degree [5]   Craige CottaLewis, GirlB Pavneet [[id number]][[id number]]  4th degree [5]  Patient had delivery of a Viable infant.  Information for the patient's newborn:  Otis BraceLewis, GirlA Keerat [[id number]][[id number]]  Delivery Method: Vag-Spont Information for the patient's newborn:  Craige CottaLewis, GirlB Mattie  [[id number]][[id number]]  Delivery Method: Starlyn SkeansVag-Vacuum     Ramanathan, GirlA Cecil [[id number]][[id number]]  09/28/2015   Craige CottaLewis, GirlB Khaniya [[id number]][[id number]]  09/28/2015  Details of delivery can be found in separate delivery note.  Patient had a routine postpartum course. Patient is discharged home 09/30/2015.   Physical exam  Filed Vitals:   09/28/15 2129 09/28/15 2345 09/29/15 1008 09/30/15 0600  BP: 131/70 114/61 119/70 103/58  Pulse: 91 99 87 74  Temp: 98.2 F (36.8 C) 98.7 F (37.1 C) 98.3 F (36.8 C) 97.8 F (36.6 C)  TempSrc: Oral Oral Oral Oral  Resp: 18 18 18 18   Height:      Weight:      SpO2:       General: alert, cooperative and no distress Lochia: appropriate Uterine Fundus: firm DVT Evaluation: No evidence of DVT seen on physical exam. Negative Homan's sign. No cords or calf tenderness. Calf/Ankle edema is present Labs: Lab Results  Component Value Date   WBC 9.0  09/29/2015   HGB 9.7* 09/29/2015   HCT 29.2* 09/29/2015   MCV 82.5 09/29/2015   PLT 146* 09/29/2015   CMP Latest Ref Rng 06/22/2015  Glucose 65 - 99 mg/dL 81  BUN 6 - 20 mg/dL 9  Creatinine 2.130.44 - 0.861.00 mg/dL 5.780.51  Sodium 469135 - 629145 mmol/L 137  Potassium 3.5 - 5.1 mmol/L 4.3  Chloride 101 - 111 mmol/L 107  CO2 22 - 32 mmol/L 25  Calcium 8.9 - 10.3 mg/dL 9.4  Total Protein 6.5 - 8.1 g/dL 6.0(L)  Total Bilirubin 0.3 - 1.2 mg/dL 0.8  Alkaline Phos 38 - 126 U/L 49  AST 15 - 41 U/L 17  ALT 14 - 54 U/L 17    Discharge instruction: per After Visit Summary and "Baby and Me Booklet".  After visit meds:    Medication List    STOP taking these medications        progesterone 200 MG capsule  Commonly known as:  PROMETRIUM      TAKE these medications        aspirin 81 MG tablet  Take 81 mg by mouth daily.     cyclobenzaprine 5 MG tablet  Commonly known as:  FLEXERIL  Take 1 tablet (5 mg total) by mouth at bedtime.     docusate sodium 100 MG capsule  Commonly known as:  COLACE  Take 1 capsule (100 mg total) by  mouth 2 (two) times daily.     docusate sodium 100 MG capsule  Commonly known as:  COLACE  Take 1 capsule (100 mg total) by mouth 2 (two) times daily.     ibuprofen 600 MG tablet  Commonly known as:  ADVIL,MOTRIN  Take 1 tablet (600 mg total) by mouth every 6 (six) hours.     magnesium oxide 400 (241.3 Mg) MG tablet  Commonly known as:  MAG-OX  Take 1 tablet (400 mg total) by mouth daily.     oxyCODONE-acetaminophen 5-325 MG tablet  Commonly known as:  PERCOCET/ROXICET  Take 1-2 tablets by mouth every 4 (four) hours as needed for moderate pain or severe pain.     pantoprazole 40 MG tablet  Commonly known as:  PROTONIX  Take 1 tablet (40 mg total) by mouth daily.     polyethylene glycol packet  Commonly known as:  MIRALAX / GLYCOLAX  Take 17 g by mouth daily as needed for moderate constipation.     prenatal multivitamin Tabs tablet  Take 1 tablet by mouth daily at 12 noon.     sertraline 50 MG tablet  Commonly known as:  ZOLOFT  Take 1 tablet (50 mg total) by mouth daily.        Diet: routine diet  Activity: Advance as tolerated. Pelvic rest for 6 weeks.   Outpatient follow up:6 weeks Follow up Appt:No future appointments. Follow up Visit:No Follow-up on file.  Postpartum contraception: Undecided  Newborn Data:   Otis BraceLewis, GirlA [place] [[id number]][[id number]]  Live born female on 09/28/2015 Birth Weight: 6 lb 9.5 oz (2990 g) APGAR: 7, [address]   Craige CottaLewis, GirlB Escarlet [[id number]][[id number]]  Live born female on 09/28/2015 Birth Weight: 7 lb 2.8 oz (3255 g) APGAR: 9, 9  Baby Feeding: Breast Disposition:home with mother   09/30/2015 Raelyn MoraAWSON, Nichalas Coin, Judie PetitM, CNM

## 2015-09-30 NOTE — Progress Notes (Signed)
Patient up ambulating independently. Mother in room assisting with twins. Patient syringed fed babies once at beginning of shift, lactation seen patient and stated to move forward with SNS feeding. Emeterio ReeveBabyA was only able to feed 2mL and spitty. BabyB was able to tolerate 20mL. Will continue to monitor.

## 2015-10-01 ENCOUNTER — Ambulatory Visit: Payer: Self-pay

## 2015-10-01 NOTE — Lactation Note (Signed)
This note was copied from a baby's chart. Lactation Consultation Note  Patient Name: Otis BraceGirlA Tasmia Fazekas ZOXWR'UToday's Date: 10/01/2015 Reason for consult: Follow-up assessment;Late preterm infant;Multiple gestation   Follow up with mom of 37 week twins at 67 hours. Mom reports she is pumping and feeding infants via bottle at this time.   Twin A Lexie- Weight 6 lb 4.7 oz with 5 % weight loss since birth. 8 bottle feeds of Alimentum/EBM 4-30 cc, BF attempt x 1 with #24 NS, 5 voids and 0 stool in 24 hours preceding this assessment. 2 stools in life.  Twin B Lily- Weight 6 lb 12 oz with 6% weight loss since birth. Infant with 2 BF attempts using # 24 NS, 9 bottle feeds of Alimentum/EBM 3-35 cc, 8 voids, 4 stools in 24 hours preceding this assessment. LATCH scores 8-9 by bedside RN.   Mom reports twin B feeds better that twin A. She reports her breast are feeling fuller . She has pumped 4 x since midnight and is getting about 3-3 cc/pumping. She is planning to use a Spectra 2 pump for home. Enc her to pay attention to her body and to make sure breasts are emptied with each pumping. Mom voiced understanding. Discussed supplementation amounts and increasing based on day of age. Advised mom that infant should feed 8-12 x in 24 hours and to pump 8-12 x in 24 hours preferably after BF. Mom voiced understanding.  Infants with f/u ped appt tomorrow. Mom declined making a LC f/u appt today, she would like to get home and call back for appt.   Reviewed all BF information in Taking Care of Baby and me Booklet. Reviewed I/O and enc mom to maintain feeding log for each infant. Engorgement prevention/treatment reviewed. Reviewed LC Brochure, mom aware of OP Services, BF Support Groups and LC phone #. Enc mom to call with questions/concerns prn.   Maternal Data Formula Feeding for Exclusion: Yes Reason for exclusion: Mother's choice to formula and breast feed on admission Has patient been taught Hand Expression?:  Yes  Feeding Feeding Type: Breast Milk with Formula added  LATCH Score/Interventions                      Lactation Tools Discussed/Used WIC Program: No Pump Review: Setup, frequency, and cleaning;Milk Storage Initiated by:: Reviewed by Max FickleS Kamyrah Feeser   Consult Status Consult Status: Complete Follow-up type: Call as needed    Ed BlalockSharon S Kasara Schomer 10/01/2015, 12:42 PM

## 2017-02-02 IMAGING — US US MFM OB LIMITED
1 series · 14 of 28 positions shown · non-contrast
Comparison: none

[Series 1: us mfm ob limited · 39 acquisitions, 14 frames shown]
[im 2/39]
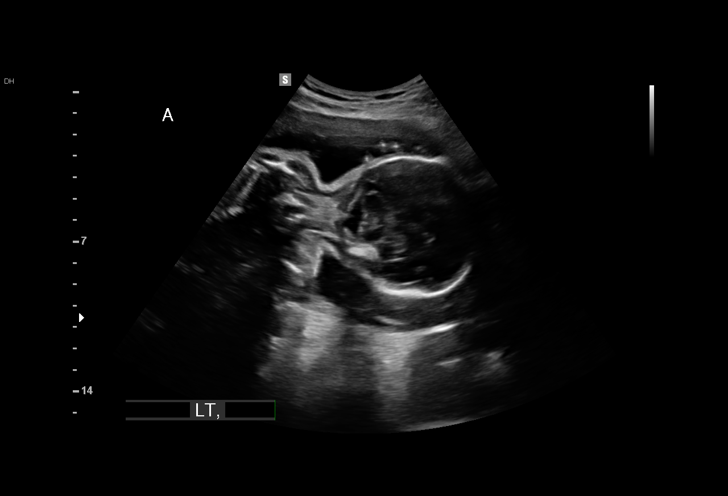
[im 5/39]
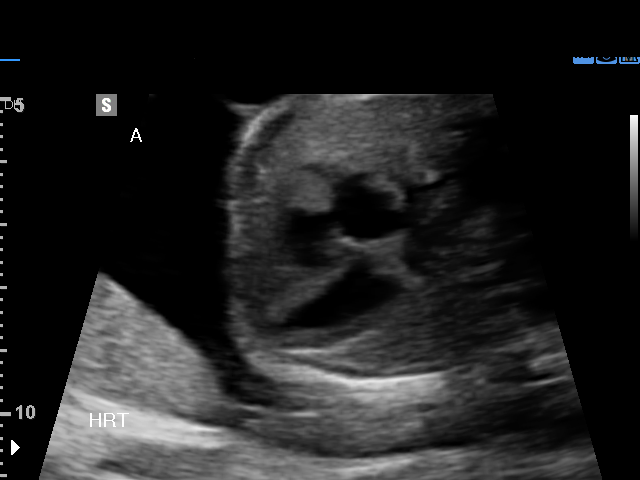
[im 8/39]
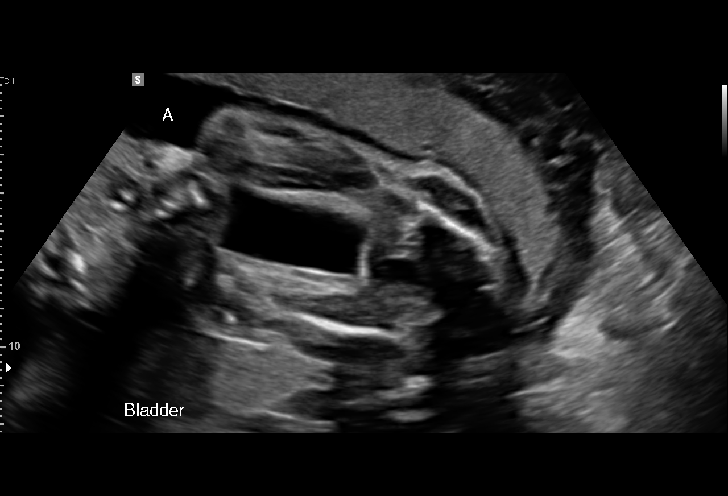
[im 10/39]
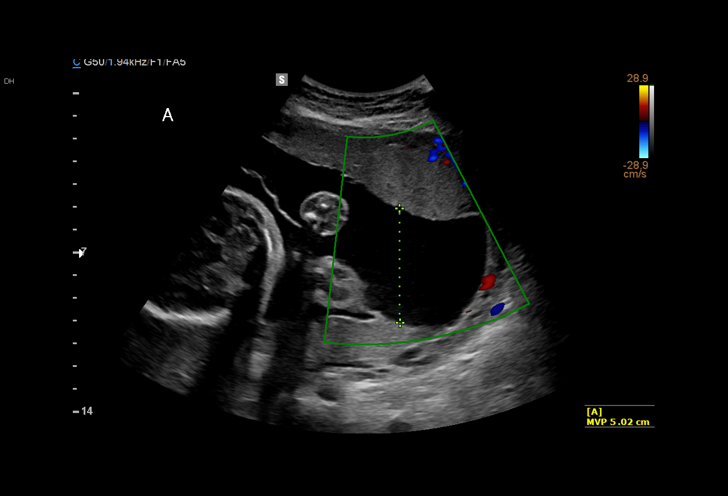
[im 13/39]
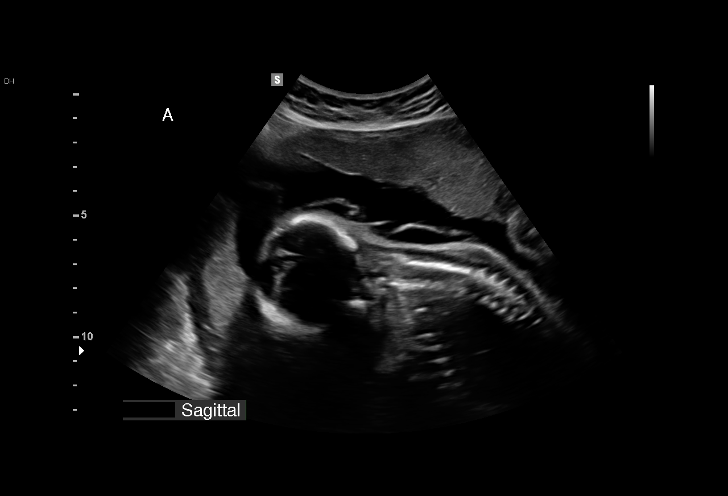
[im 16/39]
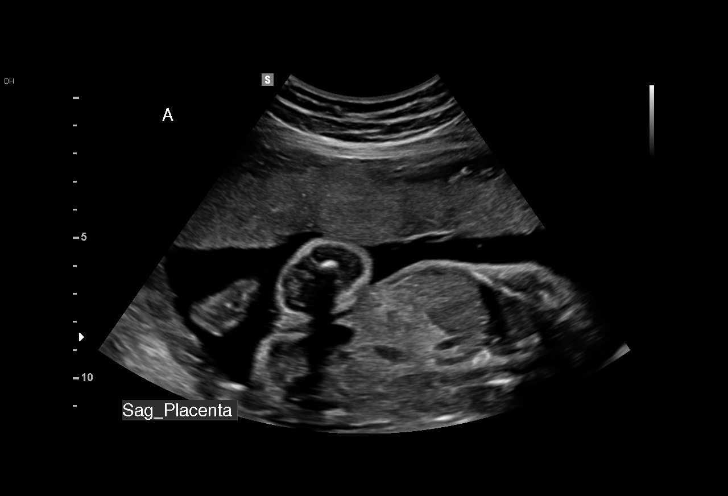
[im 19/39]
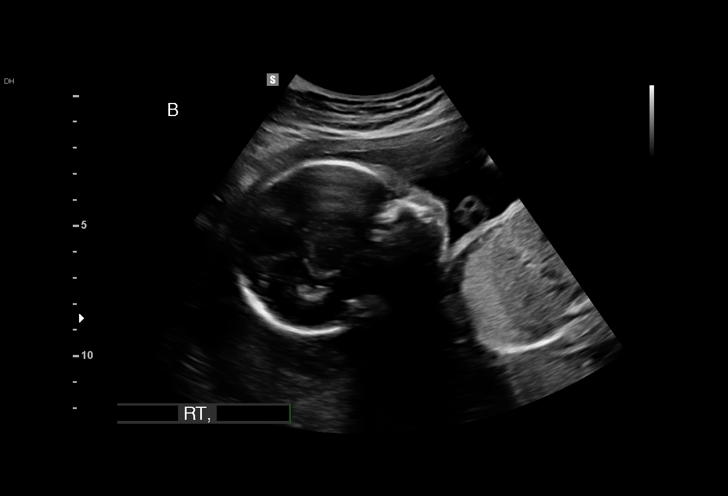
[im 22/39]
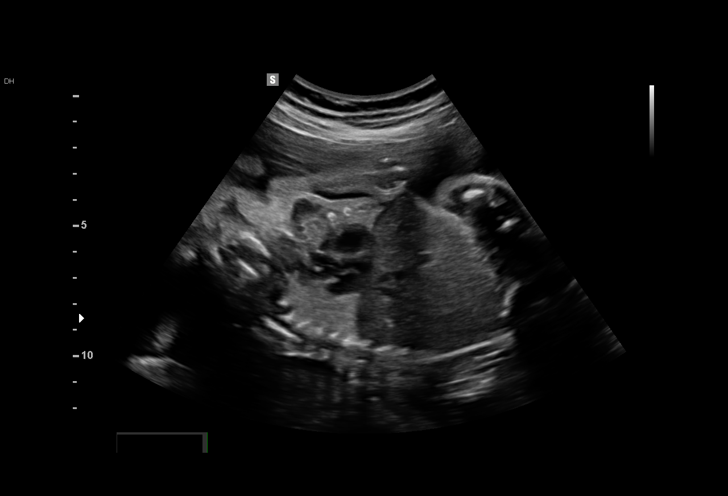
[im 24/39]
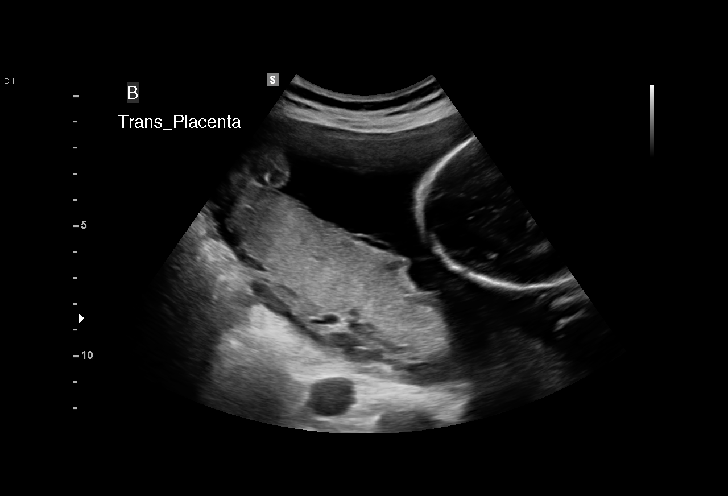
[im 27/39]
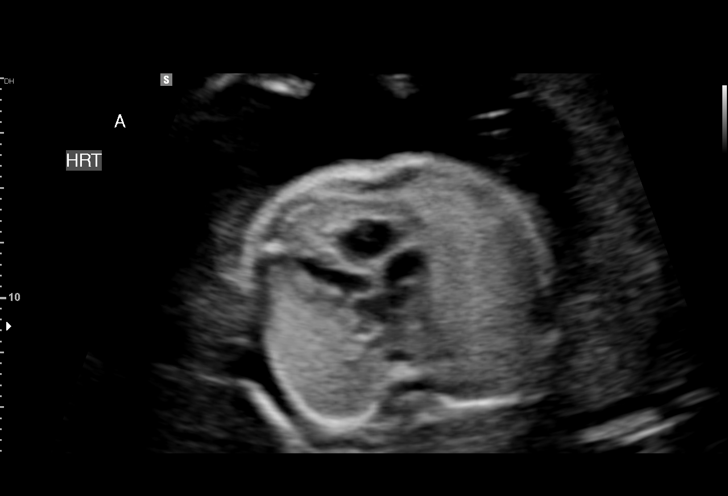
[im 30/39]
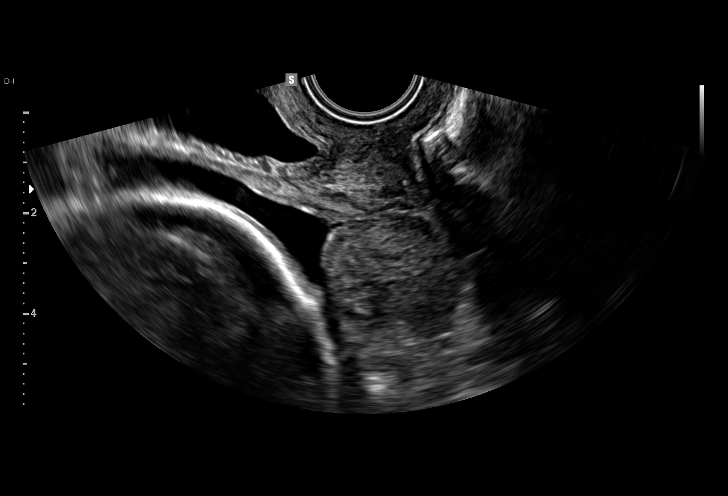
[im 33/39]
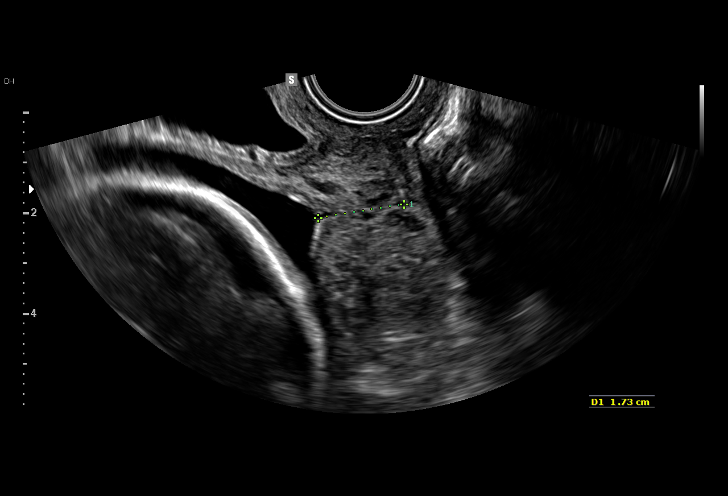
[im 36/39]
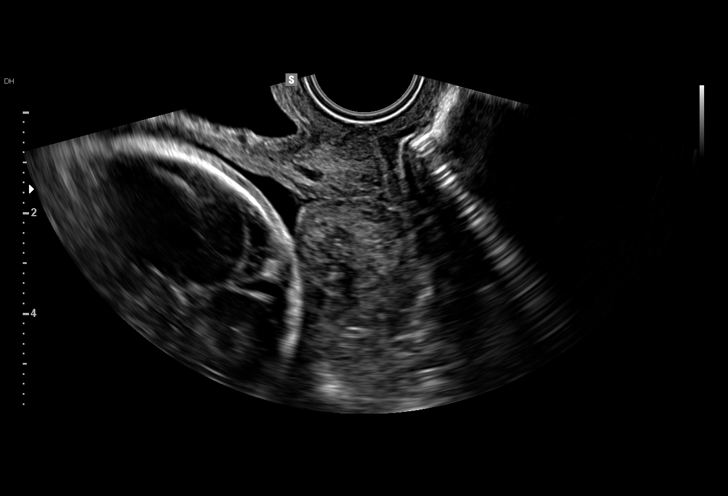
[im 39/39]
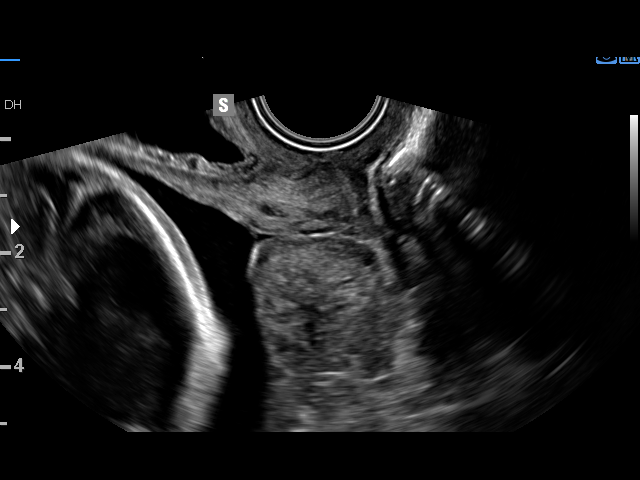

[14 of 28 positions shown; findings below may reference images not displayed]

OB/GYN &
Infertility
[id number] [HOSPITAL]
Antenatal [REDACTED]9

1  BAMBUCAFE TARLA           [id number]      [id number]     [id number]
2  BAMBUCAFE TARLA           [id number]      [id number]     [id number]
Indications

24 weeks gestation of pregnancy
Twin pregnancy, di/di, second trimester
Cervical incompetence, second trimester;
vaginal Zemary
Fetal arrhythmia affecting pregnancy,          O76
antepartum (Twin A)
Pregnancy resulting from assisted
reproductive technology (young egg donor)
Advanced maternal age multigravida 39,
second trimester
Poor obstetric history: Previous
preeclampsia / eclampsia/gestational HTN
BMZ [DATE], [DATE]
OB History

Gravidity:    5         Term:   1        Prem:   0        SAB:   3
TOP:          0       Ectopic:  0        Living: 1
Fetal Evaluation (Fetus A)

Num Of Fetuses:     2
Fetal Heart         140
Rate(bpm):
Cardiac Activity:   Arrhythmia / Bradycardia
Fetal Lie:          Left Fetus
Presentation:       Cephalic
Placenta:           Anterior, above cervical os
P. Cord Insertion:  Previously Visualized
Membrane Desc:      Dividing Membrane seen - Dichorionic.

Amniotic Fluid
AFI FV:      Subjectively within normal limits
Larg Pckt:     5.0  cm
Gestational Age (Fetus A)

Clinical EDD:  24w 0d                                        EDD:   10/19/15
Best:          24w 0d    Det. By:   Clinical EDD             EDD:   10/19/15

Fetal Evaluation (Fetus B)

Num Of Fetuses:     2
Fetal Heart         144
Rate(bpm):
Cardiac Activity:   Observed
Fetal Lie:          Right Fetus
Presentation:       Breech
Placenta:           Posterior, above cervical os
P. Cord Insertion:  Previously Visualized
Membrane Desc:      Dividing Membrane seen - Dichorionic.

Amniotic Fluid
AFI FV:      Subjectively within normal limits
Larg Pckt:     5.2  cm
Gestational Age (Fetus B)

Clinical EDD:  24w 0d                                        EDD:   10/19/15
Best:          24w 0d    Det. By:   Clinical EDD             EDD:   10/19/15
Cervix Uterus Adnexa

Cervix
Length:            1.6  cm.
Measured transvaginally.
Impression

Dichorionic/diamniotic twin pregnancy at 24+0 weeks
Twin A: Bhebhe arrhythmia; no hydrops
EV views of cervix: shortened cervix with minimal funneling
measuring 1.6 - 1.7 cms
Recommendations

Follow-up ultrasound for cervical length in 1-2 weeks
Continue vaginal Zemary

## 2017-03-02 IMAGING — US US MFM OB TRANSVAGINAL
1 series · 15 of 28 positions shown · non-contrast
Comparison: none

[Series 1: us mfm ob transvaginal · 31 acquisitions, 15 frames shown]
[im 1/31]
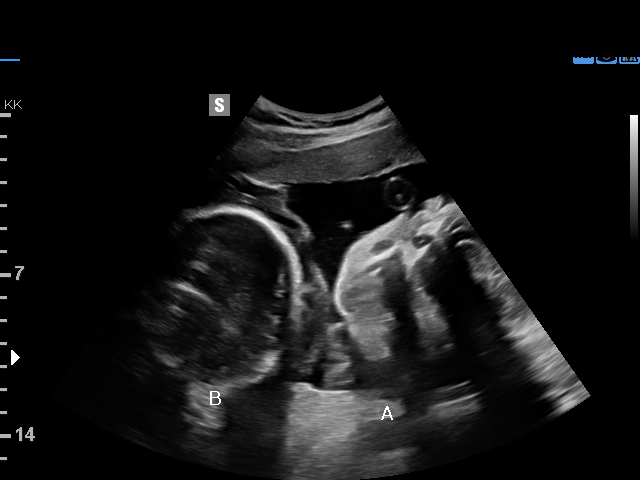
[im 3/31]
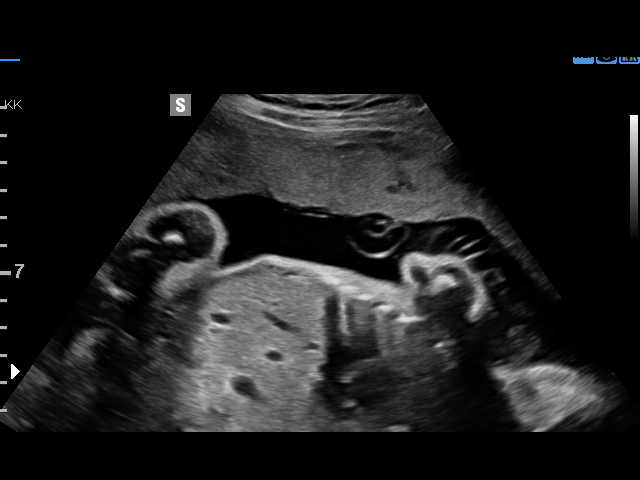
[im 5/31]
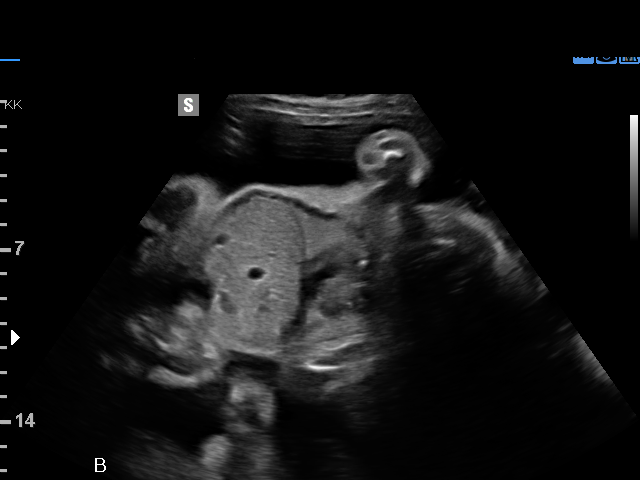
[im 7/31]
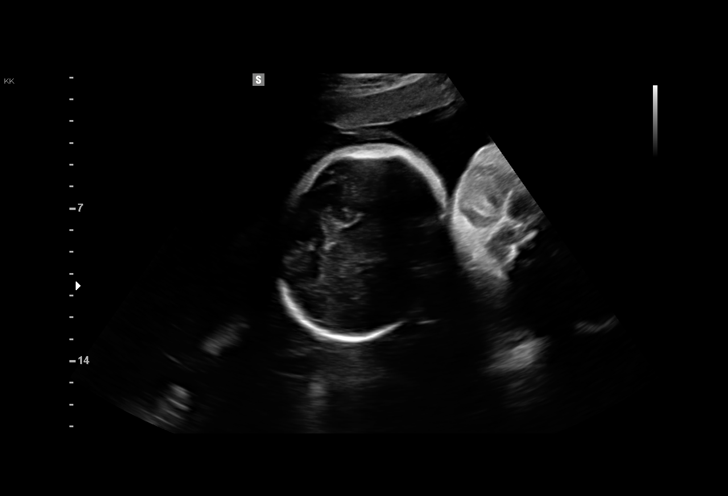
[im 9/31]
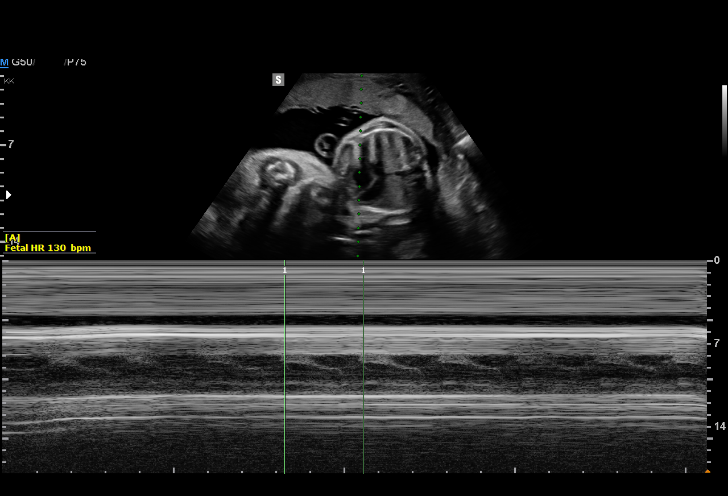
[im 12/31]
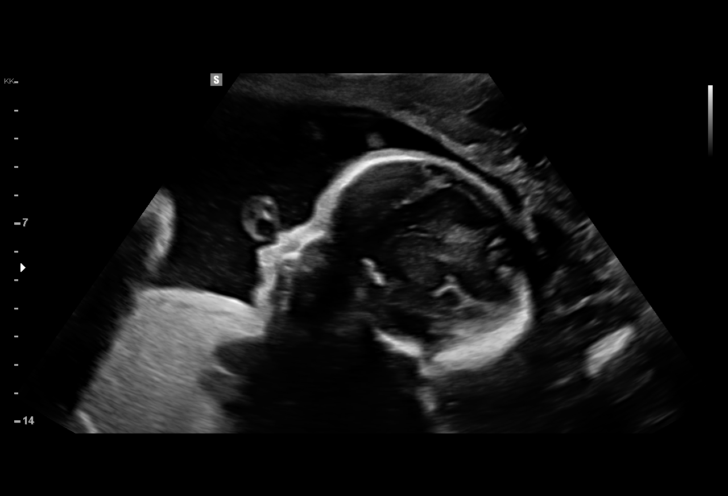
[im 14/31]
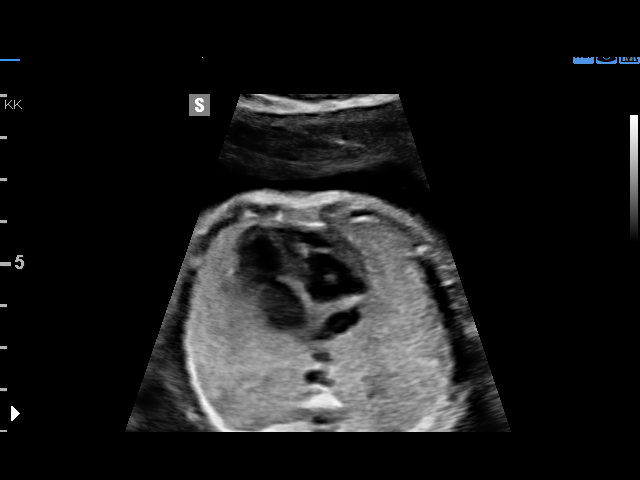
[im 16/31]
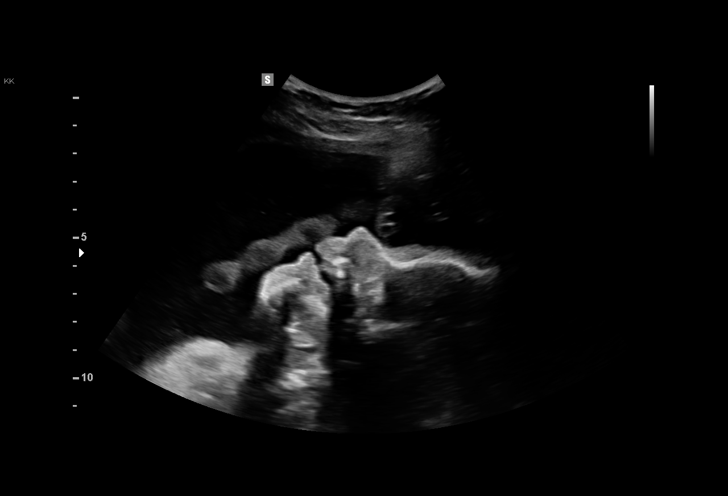
[im 17/31]
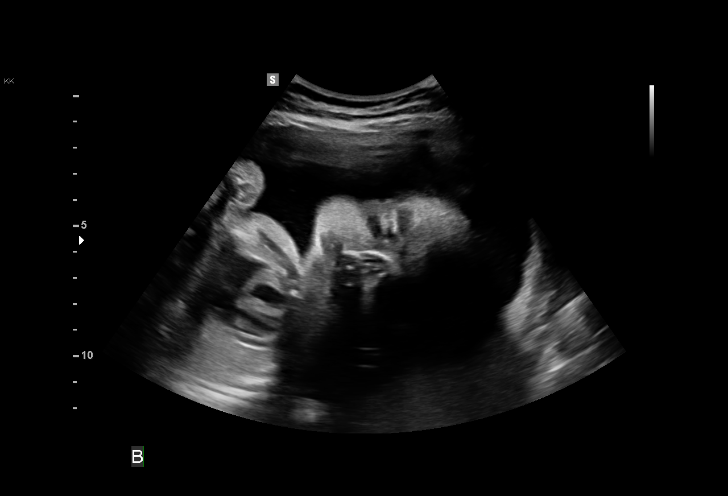
[im 19/31]
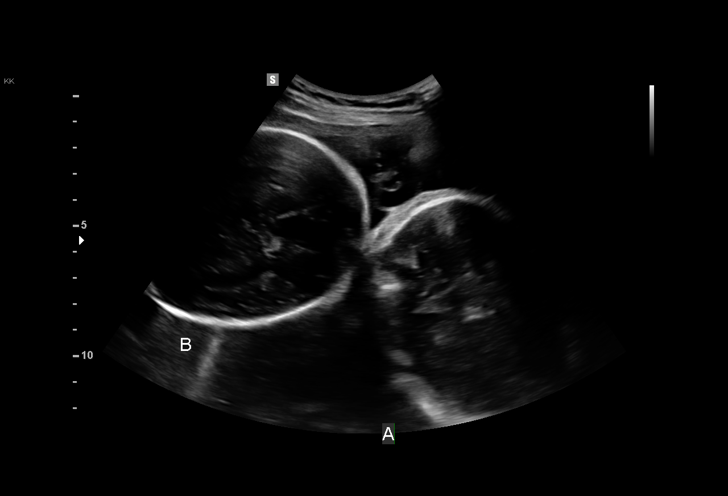
[im 22/31]
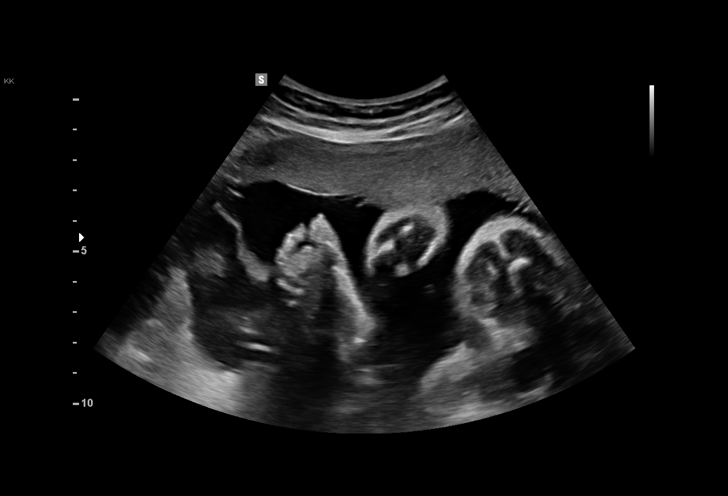
[im 24/31]
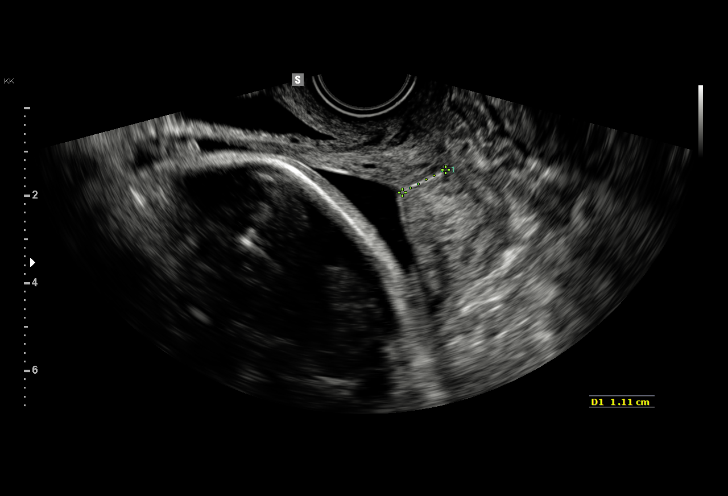
[im 26/31]
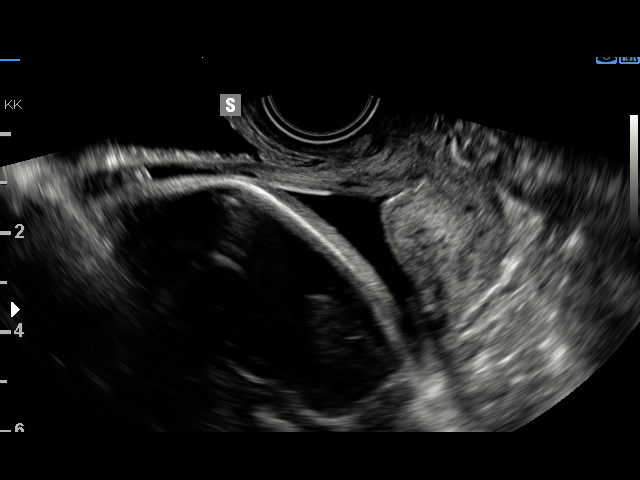
[im 28/31]
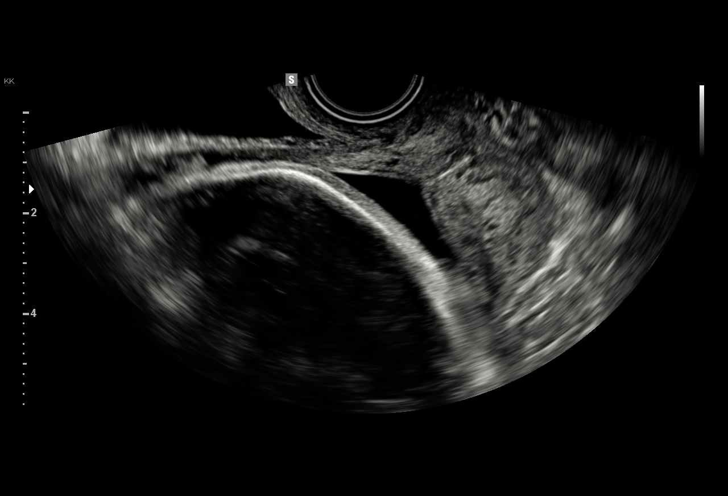
[im 31/31]
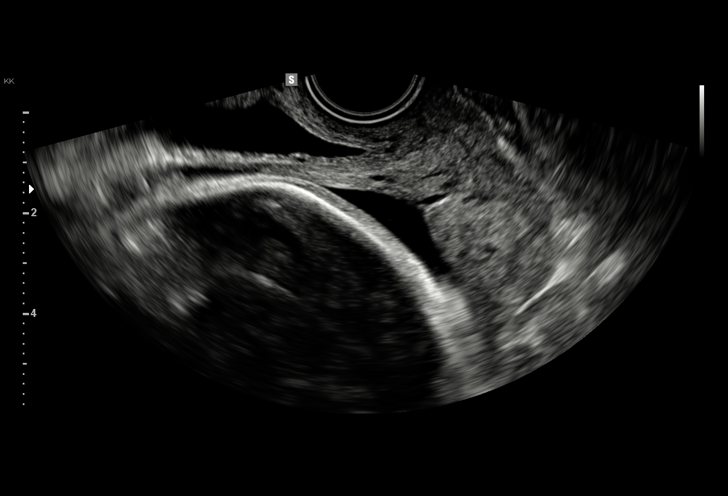

[15 of 28 positions shown; findings below may reference images not displayed]

OB/GYN &
Infertility
[id number] [HOSPITAL]
Antenatal [REDACTED]9

1  CARLOS GONCALVES LUIS VENNEKER           [id number]      [id number]     [id number]
Indications

28 weeks gestation of pregnancy
Twin pregnancy, di/di, second trimester
Cervical incompetence, second trimester;
vaginal Dedarul
Fetal arrhythmia affecting pregnancy,          O76
antepartum (Twin A)
Pregnancy resulting from assisted
reproductive technology (young egg donor)
Advanced maternal age multigravida 39,
second trimester
Poor obstetric history: Previous
preeclampsia / eclampsia/gestational HTN
BMZ [DATE], [DATE]
OB History

Gravidity:    5         Term:   1        Prem:   0         SAB:   3
TOP:          0       Ectopic:  0        Living: 1
Fetal Evaluation (Fetus A)

Num Of Fetuses:     2
Fetal Heart         130
Rate(bpm):
Cardiac Activity:   Observed
Fetal Lie:          Lower Left Fetus
Presentation:       Cephalic
Gestational Age (Fetus A)

Clinical EDD:  28w 0d                                        EDD:    10/19/15
Best:          28w 0d     Det. By:  Clinical EDD             EDD:    10/19/15

Fetal Evaluation (Fetus B)

Num Of Fetuses:     2
Fetal Heart         135
Rate(bpm):
Cardiac Activity:   Observed
Fetal Lie:          Upper Right Fetus
Presentation:       Cephalic
Gestational Age (Fetus B)

Clinical EDD:  28w 0d                                        EDD:    10/19/15
Best:          28w 0d     Det. By:  Clinical EDD             EDD:    10/19/15
Cervix Uterus Adnexa

Cervix
Length:            1.2  cm.
Measured transvaginally.
Impression

Dichorionic/diamniotic twin pregnancy at 28+0 weeks
Normal amniotic fluid volume x 2
EV views of cervix: shortened cervix measuring 1.2 cms
Recommendations

Follow-up as clinically indicated

## 2019-06-02 ENCOUNTER — Ambulatory Visit: Payer: BLUE CROSS/BLUE SHIELD | Attending: Internal Medicine

## 2019-06-02 DIAGNOSIS — Z23 Encounter for immunization: Secondary | ICD-10-CM

## 2019-06-02 NOTE — Progress Notes (Signed)
   Covid-19 Vaccination Clinic  Name:  Bethany Mendoza    MRN: [id number] DOB: 1975-04-13  06/02/2019  [name] was observed post Covid-19 immunization for 15 minutes without incidence. She was provided with Vaccine Information Sheet and instruction to access the V-Safe system.   [name] was instructed to call 911 with any severe reactions post vaccine: Marland Kitchen Difficulty breathing  . Swelling of your face and throat  . A fast heartbeat  . A bad rash all over your body  . Dizziness and weakness    Immunizations Administered    Name Date Dose VIS Date Route   Pfizer COVID-19 Vaccine 06/02/2019  9:49 AM 0.3 mL 03/15/2019 Intramuscular   Manufacturer: [employer], [employer]   Lot: [id number]   NDC: [address]-1

## 2019-06-24 ENCOUNTER — Ambulatory Visit: Payer: Self-pay | Attending: Internal Medicine

## 2019-06-24 DIAGNOSIS — Z23 Encounter for immunization: Secondary | ICD-10-CM

## 2019-06-24 NOTE — Progress Notes (Signed)
   Covid-19 Vaccination Clinic  Name:  Bethany Mendoza    MRN: [id number] DOB: 1975-09-24  06/24/2019  [name] was observed post Covid-19 immunization for 15 minutes without incident. She was provided with Vaccine Information Sheet and instruction to access the V-Safe system.   [name] was instructed to call 911 with any severe reactions post vaccine: Marland Kitchen Difficulty breathing  . Swelling of face and throat  . A fast heartbeat  . A bad rash all over body  . Dizziness and weakness   Immunizations Administered    Name Date Dose VIS Date Route   Pfizer COVID-19 Vaccine 06/24/2019 10:20 AM 0.3 mL 03/15/2019 Intramuscular   Manufacturer: [employer], [employer]   Lot: [id number]   NDC: [address]-4
